# Patient Record
Sex: Female | Born: 1990 | Race: Black or African American | Hispanic: No | Smoking: Never smoker
Health system: Southern US, Community
[De-identification: ages and names within clinical notes are randomized; demographics above are authoritative.]

## PROBLEM LIST (undated history)

## (undated) DIAGNOSIS — A749 Chlamydial infection, unspecified: Secondary | ICD-10-CM

## (undated) HISTORY — PX: NO PAST SURGERIES: SHX2092

---
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
## 2014-10-20 ENCOUNTER — Emergency Department (HOSPITAL_BASED_OUTPATIENT_CLINIC_OR_DEPARTMENT_OTHER)
Admission: EM | Admit: 2014-10-20 | Discharge: 2014-10-20 | Disposition: A | Discharge status: Home / Self Care | Payer: Self-pay | Attending: Emergency Medicine | Admitting: Emergency Medicine

## 2014-10-20 ENCOUNTER — Encounter (HOSPITAL_BASED_OUTPATIENT_CLINIC_OR_DEPARTMENT_OTHER): Payer: Self-pay | Admitting: *Deleted

## 2014-10-20 ENCOUNTER — Emergency Department (HOSPITAL_BASED_OUTPATIENT_CLINIC_OR_DEPARTMENT_OTHER): Payer: Self-pay

## 2014-10-20 DIAGNOSIS — Z3490 Encounter for supervision of normal pregnancy, unspecified, unspecified trimester: Secondary | ICD-10-CM

## 2014-10-20 DIAGNOSIS — A599 Trichomoniasis, unspecified: Secondary | ICD-10-CM

## 2014-10-20 DIAGNOSIS — Z331 Pregnant state, incidental: Secondary | ICD-10-CM | POA: Insufficient documentation

## 2014-10-20 DIAGNOSIS — R52 Pain, unspecified: Secondary | ICD-10-CM

## 2014-10-20 DIAGNOSIS — A5901 Trichomonal vulvovaginitis: Secondary | ICD-10-CM | POA: Insufficient documentation

## 2014-10-20 LAB — URINALYSIS, ROUTINE W REFLEX MICROSCOPIC
Glucose, UA: NEGATIVE mg/dL
HGB URINE DIPSTICK: NEGATIVE
KETONES UR: 15 mg/dL — AB
NITRITE: NEGATIVE
Protein, ur: NEGATIVE mg/dL
SPECIFIC GRAVITY, URINE: 1.043 — AB (ref 1.005–1.030)
Urobilinogen, UA: 2 mg/dL — ABNORMAL HIGH (ref 0.0–1.0)
pH: 6 (ref 5.0–8.0)

## 2014-10-20 LAB — WET PREP, GENITAL: Yeast Wet Prep HPF POC: NONE SEEN

## 2014-10-20 LAB — PREGNANCY, URINE: Preg Test, Ur: POSITIVE — AB

## 2014-10-20 LAB — URINE MICROSCOPIC-ADD ON

## 2014-10-20 LAB — HCG, QUANTITATIVE, PREGNANCY: HCG, BETA CHAIN, QUANT, S: 29574 m[IU]/mL — AB (ref <5)

## 2014-10-20 MED ORDER — METRONIDAZOLE 500 MG PO TABS: 500.0000 mg | ORAL_TABLET | Freq: Two times a day (BID) | ORAL | Status: DC

## 2014-10-20 NOTE — ED Notes (Signed)
Patient transported to Ultrasound 

## 2014-10-20 NOTE — ED Notes (Signed)
MD at bedside. 

## 2014-10-20 NOTE — ED Provider Notes (Signed)
CSN: [id number]     Arrival date & time 10/20/14  1946 History  This chart was scribed for Rolan BuccoMelanie Clydia Nieves, MD by Octavia HeirArianna Nassar, ED Scribe. This patient was seen in room MH09/MH09 and the patient's care was started at 8:53 PM.     Chief Complaint  Patient presents with  . Abdominal Pain      The history is provided by the patient. No language interpreter was used.   HPI Comments: Bethany Cobb is a 24 y.o. female who presents to the Emergency Department complaining of lower abdominal pain onset 4 days ago. Pt has associated vaginal discharge, headaches and nausea. Pt reports her last menstrual cycle in mid-April and reports spotting in May. Pt was on the depo shot for birth control but stopped taking it. She denies difficulty urinating and bleeding.  History reviewed. No pertinent past medical history. History reviewed. No pertinent past surgical history. No family history on file. History  Substance Use Topics  . Smoking status: Never Smoker   . Smokeless tobacco: Not on file  . Alcohol Use: Yes   OB History    No data available     Review of Systems  Constitutional: Negative for fever, chills, diaphoresis and fatigue.  HENT: Negative for congestion, rhinorrhea and sneezing.   Eyes: Negative.   Respiratory: Negative for cough, chest tightness and shortness of breath.   Cardiovascular: Negative for chest pain and leg swelling.  Gastrointestinal: Positive for nausea and abdominal pain. Negative for vomiting, diarrhea and blood in stool.  Genitourinary: Positive for vaginal discharge. Negative for frequency, hematuria, flank pain, vaginal bleeding and difficulty urinating.  Musculoskeletal: Negative for back pain and arthralgias.  Skin: Negative for rash.  Neurological: Negative for dizziness, speech difficulty, weakness, numbness and headaches.      Allergies  Review of patient's allergies indicates no known allergies.  Home Medications   Prior to Admission medications    Medication Sig Start Date End Date Taking? Authorizing Provider  metroNIDAZOLE (FLAGYL) 500 MG tablet Take 1 tablet (500 mg total) by mouth 2 (two) times daily. One po bid x 7 days 10/20/14   Rolan BuccoMelanie Maleyah Evans, MD   Triage vitals: BP 110/58 mmHg  Pulse 90  Temp(Src) 98.6 F (37 C) (Oral)  Resp 20  Ht 5' (1.524 m)  Wt 108 lb 6 oz (49.159 kg)  BMI 21.17 kg/m2  SpO2 100%  LMP 09/04/2014 Physical Exam  Constitutional: She is oriented to person, place, and time. She appears well-developed and well-nourished.  HENT:  Head: Normocephalic and atraumatic.  Eyes: Pupils are equal, round, and reactive to light.  Neck: Normal range of motion. Neck supple.  Cardiovascular: Normal rate, regular rhythm and normal heart sounds.   Pulmonary/Chest: Effort normal and breath sounds normal. No respiratory distress. She has no wheezes. She has no rales. She exhibits no tenderness.  Abdominal: Soft. Bowel sounds are normal. There is no tenderness. There is no rebound and no guarding.  Genitourinary:  Patient has copious amounts of yellow-green vaginal discharge. There is no cervical motion tenderness or adnexal tenderness.  Musculoskeletal: Normal range of motion. She exhibits no edema.  Lymphadenopathy:    She has no cervical adenopathy.  Neurological: She is alert and oriented to person, place, and time.  Skin: Skin is warm and dry. No rash noted.  Psychiatric: She has a normal mood and affect.    ED Course  Procedures  DIAGNOSTIC STUDIES: Oxygen Saturation is 100% on RA, normal by my interpretation.  COORDINATION OF CARE:  8:55 PM Discussed treatment plan which includes ultrasound and pelvic exam with pt at bedside and pt agreed to plan.   Labs Review Labs Reviewed  WET PREP, GENITAL - Abnormal; Notable for the following:    Trich, Wet Prep MODERATE (*)    Clue Cells Wet Prep HPF POC FEW (*)    WBC, Wet Prep HPF POC TOO NUMEROUS TO COUNT (*)    All other components within normal limits   URINALYSIS, ROUTINE W REFLEX MICROSCOPIC (NOT AT [facility]) - Abnormal; Notable for the following:    Color, Urine AMBER (*)    APPearance CLOUDY (*)    Specific Gravity, Urine 1.043 (*)    Bilirubin Urine SMALL (*)    Ketones, ur 15 (*)    Urobilinogen, UA 2.0 (*)    Leukocytes, UA MODERATE (*)    All other components within normal limits  PREGNANCY, URINE - Abnormal; Notable for the following:    Preg Test, Ur POSITIVE (*)    All other components within normal limits  URINE MICROSCOPIC-ADD ON - Abnormal; Notable for the following:    Bacteria, UA MANY (*)    All other components within normal limits  HCG, QUANTITATIVE, PREGNANCY - Abnormal; Notable for the following:    hCG, Beta Chain, Quant, S [address] (*)    All other components within normal limits  URINE CULTURE  HIV ANTIBODY (ROUTINE TESTING)  RPR  GC/CHLAMYDIA PROBE AMP (March ARB) NOT AT [facility]    Imaging Review US Ob Comp Less 14 Wks  10/20/2014   CLINICAL DATA:  Pelvic cramping for 1 week.  Vaginal discharge.  EXAM: OBSTETRIC <14 WK [place] AND TRANSVAGINAL OB US  TECHNIQUE: Both transabdominal and transvaginal ultrasound examinations were performed for complete evaluation of the gestation as well as the maternal uterus, adnexal regions, and pelvic cul-de-sac. Transvaginal technique was performed to assess early pregnancy.  COMPARISON:  None.  FINDINGS: Intrauterine gestational sac: Single  Yolk sac:  Yes  Embryo:  No  MSD: 14  mm   6 w   2  d  [place] EDC: 06/13/2015  Maternal uterus/adnexae: Small subchorionic hemorrhage. Normal left ovary. 2.8 x 1.8 x 1.7 cm area within the right ovary which may represent a hemorrhagic corpus luteum cyst.  IMPRESSION: Gestational sac, 6 weeks 2 days in size. No visible embryo. Small subchorionic hemorrhage. Probable hemorrhagic corpus luteum cyst.   Electronically Signed   By: Francene Boyers M.D.   On: 10/20/2014 21:50   US Ob Transvaginal  10/20/2014   CLINICAL DATA:  Pelvic cramping for 1 week.  Vaginal  discharge.  EXAM: OBSTETRIC <14 WK [place] AND TRANSVAGINAL OB US  TECHNIQUE: Both transabdominal and transvaginal ultrasound examinations were performed for complete evaluation of the gestation as well as the maternal uterus, adnexal regions, and pelvic cul-de-sac. Transvaginal technique was performed to assess early pregnancy.  COMPARISON:  None.  FINDINGS: Intrauterine gestational sac: Single  Yolk sac:  Yes  Embryo:  No  MSD: 14  mm   6 w   2  d  [place] EDC: 06/13/2015  Maternal uterus/adnexae: Small subchorionic hemorrhage. Normal left ovary. 2.8 x 1.8 x 1.7 cm area within the right ovary which may represent a hemorrhagic corpus luteum cyst.  IMPRESSION: Gestational sac, 6 weeks 2 days in size. No visible embryo. Small subchorionic hemorrhage. Probable hemorrhagic corpus luteum cyst.   Electronically Signed   By: Francene Boyers M.D.   On: 10/20/2014 21:50     EKG Interpretation None  MDM   Final diagnoses:  Trichimoniasis  IUP (intrauterine pregnancy), incidental    Patient presents with some lower abdominal discomfort nausea and vaginal discharge. She does have positive Trichomonas. She'll be treated with Flagyl. She has no bleeding. She does not think that she ever got RhoGAM shots with her prior pregnancies. But since she has no bleeding I did not feel that she needed an Rh type tonight. She was encouraged to start a prenatal vitamin. She was encouraged to have close follow-up with her OB/GYN in [facility]. She was advised to return if she has any worsening symptoms. She was advised that she needs to notify her partner that he needs to get tested and treated for Trichomonas as well.  I personally performed the services described in this documentation, which was scribed in my presence.  The recorded information has been reviewed and considered.   Rolan Bucco, MD 10/20/14 [phone]

## 2014-10-20 NOTE — Discharge Instructions (Signed)
First Trimester of Pregnancy °The first trimester of pregnancy is from week 1 until the end of week 12 (months 1 through 3). A week after a sperm fertilizes an egg, the egg will implant on the wall of the uterus. This embryo will begin to develop into a baby. Genes from you and your partner are forming the baby. The female genes determine whether the baby is a boy or a girl. At 6-8 weeks, the eyes and face are formed, and the heartbeat can be seen on ultrasound. At the end of 12 weeks, all the baby's organs are formed.  °Now that you are pregnant, you will want to do everything you can to have a healthy baby. Two of the most important things are to get good prenatal care and to follow your health care provider's instructions. Prenatal care is all the medical care you receive before the baby's birth. This care will help prevent, find, and treat any problems during the pregnancy and childbirth. °BODY CHANGES °Your body goes through many changes during pregnancy. The changes vary from woman to woman.  °· You may gain or lose a couple of pounds at first. °· You may feel sick to your stomach (nauseous) and throw up (vomit). If the vomiting is uncontrollable, call your health care provider. °· You may tire easily. °· You may develop headaches that can be relieved by medicines approved by your health care provider. °· You may urinate more often. Painful urination may mean you have a bladder infection. °· You may develop heartburn as a result of your pregnancy. °· You may develop constipation because certain hormones are causing the muscles that push waste through your intestines to slow down. °· You may develop hemorrhoids or swollen, bulging veins (varicose veins). °· Your breasts may begin to grow larger and become tender. Your nipples may stick out more, and the tissue that surrounds them (areola) may become darker. °· Your gums may bleed and may be sensitive to brushing and flossing. °· Dark spots or blotches (chloasma,  mask of pregnancy) may develop on your face. This will likely fade after the baby is born. °· Your menstrual periods will stop. °· You may have a loss of appetite. °· You may develop cravings for certain kinds of food. °· You may have changes in your emotions from day to day, such as being excited to be pregnant or being concerned that something may go wrong with the pregnancy and baby. °· You may have more vivid and strange dreams. °· You may have changes in your hair. These can include thickening of your hair, rapid growth, and changes in texture. Some women also have hair loss during or after pregnancy, or hair that feels dry or thin. Your hair will most likely return to normal after your baby is born. °WHAT TO EXPECT AT YOUR PRENATAL VISITS °During a routine prenatal visit: °· You will be weighed to make sure you and the baby are growing normally. °· Your blood pressure will be taken. °· Your abdomen will be measured to track your baby's growth. °· The fetal heartbeat will be listened to starting around week 10 or 12 of your pregnancy. °· Test results from any previous visits will be discussed. °Your health care provider may ask you: °· How you are feeling. °· If you are feeling the baby move. °· If you have had any abnormal symptoms, such as leaking fluid, bleeding, severe headaches, or abdominal cramping. °· If you have any questions. °Other tests   that may be performed during your first trimester include: °· Blood tests to find your blood type and to check for the presence of any previous infections. They will also be used to check for low iron levels (anemia) and Rh antibodies. Later in the pregnancy, blood tests for diabetes will be done along with other tests if problems develop. °· Urine tests to check for infections, diabetes, or protein in the urine. °· An ultrasound to confirm the proper growth and development of the baby. °· An amniocentesis to check for possible genetic problems. °· Fetal screens for  spina bifida and Down syndrome. °· You may need other tests to make sure you and the baby are doing well. °HOME CARE INSTRUCTIONS  °Medicines °· Follow your health care provider's instructions regarding medicine use. Specific medicines may be either safe or unsafe to take during pregnancy. °· Take your prenatal vitamins as directed. °· If you develop constipation, try taking a stool softener if your health care provider approves. °Diet °· Eat regular, well-balanced meals. Choose a variety of foods, such as meat or vegetable-based protein, fish, milk and low-fat dairy products, vegetables, fruits, and whole grain breads and cereals. Your health care provider will help you determine the amount of weight gain that is right for you. °· Avoid raw meat and uncooked cheese. These carry germs that can cause birth defects in the baby. °· Eating four or five small meals rather than three large meals a day may help relieve nausea and vomiting. If you start to feel nauseous, eating a few soda crackers can be helpful. Drinking liquids between meals instead of during meals also seems to help nausea and vomiting. °· If you develop constipation, eat more high-fiber foods, such as fresh vegetables or fruit and whole grains. Drink enough fluids to keep your urine clear or pale yellow. °Activity and Exercise °· Exercise only as directed by your health care provider. Exercising will help you: °· Control your weight. °· Stay in shape. °· Be prepared for labor and delivery. °· Experiencing pain or cramping in the lower abdomen or low back is a good sign that you should stop exercising. Check with your health care provider before continuing normal exercises. °· Try to avoid standing for long periods of time. Move your legs often if you must stand in one place for a long time. °· Avoid heavy lifting. °· Wear low-heeled shoes, and practice good posture. °· You may continue to have sex unless your health care provider directs you  otherwise. °Relief of Pain or Discomfort °· Wear a good support bra for breast tenderness.   °· Take warm sitz baths to soothe any pain or discomfort caused by hemorrhoids. Use hemorrhoid cream if your health care provider approves.   °· Rest with your legs elevated if you have leg cramps or low back pain. °· If you develop varicose veins in your legs, wear support hose. Elevate your feet for 15 minutes, 3-4 times a day. Limit salt in your diet. °Prenatal Care °· Schedule your prenatal visits by the twelfth week of pregnancy. They are usually scheduled monthly at first, then more often in the last 2 months before delivery. °· Write down your questions. Take them to your prenatal visits. °· Keep all your prenatal visits as directed by your health care provider. °Safety °· Wear your seat belt at all times when driving. °· Make a list of emergency phone numbers, including numbers for family, friends, the hospital, and police and fire departments. °General Tips °·   Ask your health care provider for a referral to a local prenatal education class. Begin classes no later than at the beginning of month 6 of your pregnancy. °· Ask for help if you have counseling or nutritional needs during pregnancy. Your health care provider can offer advice or refer you to specialists for help with various needs. °· Do not use hot tubs, steam rooms, or saunas. °· Do not douche or use tampons or scented sanitary pads. °· Do not cross your legs for long periods of time. °· Avoid cat litter boxes and soil used by cats. These carry germs that can cause birth defects in the baby and possibly loss of the fetus by miscarriage or stillbirth. °· Avoid all smoking, herbs, alcohol, and medicines not prescribed by your health care provider. Chemicals in these affect the formation and growth of the baby. °· Schedule a dentist appointment. At home, brush your teeth with a soft toothbrush and be gentle when you floss. °SEEK MEDICAL CARE IF:  °· You have  dizziness. °· You have mild pelvic cramps, pelvic pressure, or nagging pain in the abdominal area. °· You have persistent nausea, vomiting, or diarrhea. °· You have a bad smelling vaginal discharge. °· You have pain with urination. °· You notice increased swelling in your face, hands, legs, or ankles. °SEEK IMMEDIATE MEDICAL CARE IF:  °· You have a fever. °· You are leaking fluid from your vagina. °· You have spotting or bleeding from your vagina. °· You have severe abdominal cramping or pain. °· You have rapid weight gain or loss. °· You vomit blood or material that looks like coffee grounds. °· You are exposed to German measles and have never had them. °· You are exposed to fifth disease or chickenpox. °· You develop a severe headache. °· You have shortness of breath. °· You have any kind of trauma, such as from a fall or a car accident. °Document Released: 04/27/2001 Document Revised: 09/17/2013 Document Reviewed: 03/13/2013 °ExitCare® Patient Information ©2015 [employer]. This information is not intended to replace advice given to you by your health care provider. Make sure you discuss any questions you have with your health care provider. °Trichomoniasis °Trichomoniasis is an infection caused by an organism called Trichomonas. The infection can affect both women and men. In women, the outer female genitalia and the vagina are affected. In men, the penis is mainly affected, but the prostate and other reproductive organs can also be involved. Trichomoniasis is a sexually transmitted infection (STI) and is most often passed to another person through sexual contact.  °RISK FACTORS °· Having unprotected sexual intercourse. °· Having sexual intercourse with an infected partner. °SIGNS AND SYMPTOMS  °Symptoms of trichomoniasis in women include: °· Abnormal gray-green frothy vaginal discharge. °· Itching and irritation of the vagina. °· Itching and irritation of the area outside the vagina. °Symptoms of  trichomoniasis in men include:  °· Penile discharge with or without pain. °· Pain during urination. This results from inflammation of the urethra. °DIAGNOSIS  °Trichomoniasis may be found during a Pap test or physical exam. Your health care provider may use one of the following methods to help diagnose this infection: °· Examining vaginal discharge under a microscope. For men, urethral discharge would be examined. °· Testing the pH of the vagina with a test tape. °· Using a vaginal swab test that checks for the Trichomonas organism. A test is available that provides results within a few minutes. °· Doing a culture test for the organism.   This is not usually needed. °TREATMENT  °· You may be given medicine to fight the infection. Women should inform their health care provider if they could be or are pregnant. Some medicines used to treat the infection should not be taken during pregnancy. °· Your health care provider may recommend over-the-counter medicines or creams to decrease itching or irritation. °· Your sexual partner will need to be treated if infected. °HOME CARE INSTRUCTIONS  °· Take medicines only as directed by your health care provider. °· Take over-the-counter medicine for itching or irritation as directed by your health care provider. °· Do not have sexual intercourse while you have the infection. °· Women should not douche or wear tampons while they have the infection. °· Discuss your infection with your partner. Your partner may have gotten the infection from you, or you may have gotten it from your partner. °· Have your sex partner get examined and treated if necessary. °· Practice safe, informed, and protected sex. °· See your health care provider for other STI testing. °SEEK MEDICAL CARE IF:  °· You still have symptoms after you finish your medicine. °· You develop abdominal pain. °· You have pain when you urinate. °· You have bleeding after sexual intercourse. °· You develop a rash. °· Your  medicine makes you sick or makes you throw up (vomit). °MAKE SURE YOU: °· Understand these instructions. °· Will watch your condition. °· Will get help right away if you are not doing well or get worse. °Document Released: 10/27/2000 Document Revised: 09/17/2013 Document Reviewed: 02/12/2013 °ExitCare® Patient Information ©2015 [employer]. This information is not intended to replace advice given to you by your health care provider. Make sure you discuss any questions you have with your health care provider. ° °

## 2014-10-20 NOTE — ED Notes (Signed)
Pt here for lower abdominal pain with headache and change in vaginal discharge.  No urinary symptoms with this

## 2014-10-20 NOTE — ED Notes (Signed)
Patient returned from ultrasound.

## 2014-10-21 LAB — GC/CHLAMYDIA PROBE AMP (~~LOC~~) NOT AT ARMC
Chlamydia: POSITIVE — AB
Neisseria Gonorrhea: NEGATIVE

## 2014-10-22 ENCOUNTER — Telehealth (HOSPITAL_COMMUNITY): Payer: Self-pay

## 2014-10-22 LAB — URINE CULTURE: Colony Count: 10000

## 2014-10-22 LAB — RPR: RPR Ser Ql: NONREACTIVE

## 2014-10-22 LAB — HIV ANTIBODY (ROUTINE TESTING W REFLEX): HIV SCREEN 4TH GENERATION: NONREACTIVE

## 2014-10-22 NOTE — ED Notes (Signed)
Positive for chlamydia. Chart sent to edp office for review 

## 2014-10-24 ENCOUNTER — Telehealth (HOSPITAL_COMMUNITY): Payer: Self-pay

## 2014-10-24 NOTE — Telephone Encounter (Signed)
Chart returned from MD office on 10/23/2014. Orders to give azithromycin 1 gram po x 1. Pt was contacted , informed. Advised to abstain from sexual activity x 10 days and to notify partner(s) for testing and treatment.  Medication called to [facility] in [place] [phone] and left on MD line.

## 2014-12-20 ENCOUNTER — Encounter (HOSPITAL_COMMUNITY): Payer: Self-pay | Admitting: *Deleted

## 2014-12-20 ENCOUNTER — Inpatient Hospital Stay (HOSPITAL_COMMUNITY)
Admission: AD | Admit: 2014-12-20 | Discharge: 2014-12-20 | Disposition: A | Discharge status: Home / Self Care | Payer: Self-pay | Source: Ambulatory Visit | Attending: Family Medicine | Admitting: Family Medicine

## 2014-12-20 ENCOUNTER — Inpatient Hospital Stay (HOSPITAL_COMMUNITY): Payer: Self-pay

## 2014-12-20 DIAGNOSIS — O4692 Antepartum hemorrhage, unspecified, second trimester: Secondary | ICD-10-CM

## 2014-12-20 DIAGNOSIS — O039 Complete or unspecified spontaneous abortion without complication: Secondary | ICD-10-CM | POA: Insufficient documentation

## 2014-12-20 DIAGNOSIS — O26899 Other specified pregnancy related conditions, unspecified trimester: Secondary | ICD-10-CM

## 2014-12-20 DIAGNOSIS — O4690 Antepartum hemorrhage, unspecified, unspecified trimester: Secondary | ICD-10-CM

## 2014-12-20 DIAGNOSIS — R109 Unspecified abdominal pain: Secondary | ICD-10-CM

## 2014-12-20 DIAGNOSIS — Z3A14 14 weeks gestation of pregnancy: Secondary | ICD-10-CM

## 2014-12-20 DIAGNOSIS — O9989 Other specified diseases and conditions complicating pregnancy, childbirth and the puerperium: Secondary | ICD-10-CM

## 2014-12-20 HISTORY — DX: Chlamydial infection, unspecified: A74.9

## 2014-12-20 LAB — WET PREP, GENITAL
Trich, Wet Prep: NONE SEEN
Yeast Wet Prep HPF POC: NONE SEEN

## 2014-12-20 LAB — URINALYSIS, ROUTINE W REFLEX MICROSCOPIC
BILIRUBIN URINE: NEGATIVE
Glucose, UA: NEGATIVE mg/dL
KETONES UR: NEGATIVE mg/dL
NITRITE: NEGATIVE
PH: 6 (ref 5.0–8.0)
Protein, ur: NEGATIVE mg/dL
Specific Gravity, Urine: >1.03 — ABNORMAL HIGH (ref 1.005–1.030)
Urobilinogen, UA: 1 mg/dL (ref 0.0–1.0)

## 2014-12-20 LAB — HCG, QUANTITATIVE, PREGNANCY: HCG, BETA CHAIN, QUANT, S: 15 m[IU]/mL — AB (ref <5)

## 2014-12-20 LAB — URINE MICROSCOPIC-ADD ON

## 2014-12-20 LAB — CBC
HEMATOCRIT: 30.3 % — AB (ref 36.0–46.0)
HEMOGLOBIN: 9.3 g/dL — AB (ref 12.0–15.0)
MCH: 21.4 pg — ABNORMAL LOW (ref 26.0–34.0)
MCHC: 30.7 g/dL (ref 30.0–36.0)
MCV: 69.8 fL — ABNORMAL LOW (ref 78.0–100.0)
Platelets: 233 10*3/uL (ref 150–400)
RBC: 4.34 MIL/uL (ref 3.87–5.11)
RDW: 13.5 % (ref 11.5–15.5)
WBC: 8.5 10*3/uL (ref 4.0–10.5)

## 2014-12-20 LAB — POCT PREGNANCY, URINE: PREG TEST UR: NEGATIVE

## 2014-12-20 LAB — ABO/RH: ABO/RH(D): B POS

## 2014-12-20 NOTE — MAU Note (Signed)
Pt reports lower abd cramping , had some heavy bleeding one week ago but should be [redacted] weeks pregnant

## 2014-12-20 NOTE — MAU Provider Note (Signed)
History     CSN: [id number]  Arrival date and time: 12/20/14 1191   First Provider Initiated Contact with Patient 12/20/14 0700      No chief complaint on file.  HPI Comments: Bethany Cobb is a 24 y.o. G1P0 at [redacted]w[redacted]d who presents today with cramping. She states that last week she has bleeding for a couple of days, but that stopped. However, yesterday she started to have cramps again. She denies any bleeding today.   Abdominal Pain This is a new problem. The current episode started yesterday. The onset quality is gradual. The problem occurs constantly. The problem has been unchanged. The pain is located in the suprapubic region. The pain is at a severity of 3/10. The quality of the pain is cramping. The abdominal pain does not radiate. Pertinent negatives include no constipation, diarrhea, dysuria, fever, frequency, nausea or vomiting. Nothing aggravates the pain. The pain is relieved by nothing. She has tried nothing for the symptoms.    No past medical history on file.  No past surgical history on file.  No family history on file.  History  Substance Use Topics  . Smoking status: Never Smoker   . Smokeless tobacco: Not on file  . Alcohol Use: Yes    Allergies: No Known Allergies  Prescriptions prior to admission  Medication Sig Dispense Refill Last Dose  . metroNIDAZOLE (FLAGYL) 500 MG tablet Take 1 tablet (500 mg total) by mouth 2 (two) times daily. One po bid x 7 days 14 tablet 0     Review of Systems  Constitutional: Negative for fever.  Gastrointestinal: Positive for abdominal pain. Negative for nausea, vomiting, diarrhea and constipation.  Genitourinary: Negative for dysuria, urgency and frequency.   Physical Exam   Blood pressure 124/79, pulse 82, temperature 98.4 F (36.9 C), temperature source Oral, resp. rate 16, height 5' 0.5" (1.537 m), weight 49.896 kg (110 lb), last menstrual period 09/08/2014, SpO2 100 %.  Physical Exam  Nursing note and vitals  reviewed. Constitutional: She is oriented to person, place, and time. She appears well-developed and well-nourished. No distress.  HENT:  Head: Normocephalic.  Cardiovascular: Normal rate.   Respiratory: Effort normal.  GI: Soft. There is no tenderness. There is no rebound.  Genitourinary:  Unable to auscultate FHT with doppler   Neurological: She is alert and oriented to person, place, and time.  Skin: Skin is warm and dry.  Psychiatric: She has a normal mood and affect.   Results for orders placed or performed during the hospital encounter of 12/20/14 (from the past 24 hour(s))  Urinalysis, Routine w reflex microscopic (not at [facility])     Status: Abnormal   Collection Time: 12/20/14  6:40 AM  Result Value Ref Range   Color, Urine YELLOW YELLOW   APPearance CLEAR CLEAR   Specific Gravity, Urine >1.030 (H) 1.005 - 1.030   pH 6.0 5.0 - 8.0   Glucose, UA NEGATIVE NEGATIVE mg/dL   Hgb urine dipstick SMALL (A) NEGATIVE   Bilirubin Urine NEGATIVE NEGATIVE   Ketones, ur NEGATIVE NEGATIVE mg/dL   Protein, ur NEGATIVE NEGATIVE mg/dL   Urobilinogen, UA 1.0 0.0 - 1.0 mg/dL   Nitrite NEGATIVE NEGATIVE   Leukocytes, UA MODERATE (A) NEGATIVE  Urine microscopic-add on     Status: None   Collection Time: 12/20/14  6:40 AM  Result Value Ref Range   Squamous Epithelial / LPF RARE RARE   WBC, UA 3-6 <3 WBC/hpf   Bacteria, UA RARE RARE    MAU  Course  Procedures  MDM 0800: Care turned over to S. Chase Picket, NP. [place] results pending.  Tawnya Crook 12/20/2014 7:01 AM. US Ob Transvaginal  12/20/2014   CLINICAL DATA:  Pain for 1 day.  Bleeding for 1 day.  EXAM: TRANSVAGINAL OB ULTRASOUND  TECHNIQUE: Transvaginal ultrasound was performed for complete evaluation of the gestation as well as the maternal uterus, adnexal regions, and pelvic cul-de-sac.  COMPARISON:  10/20/2014  FINDINGS: Intrauterine gestational sac: Not visualized  Yolk sac:  Not visualized  Embryo:  Not visualized  Maternal  uterus/adnexae: Endometrium normal in thickness. No adnexal masses. Trace free fluid in the pelvis.  IMPRESSION: The previously seen gestational sac no longer visualized. No intrauterine gestation present currently.   Electronically Signed   By: Charlett Nose M.D.   On: 12/20/2014 08:08  discussed with pt findings of probable anembryonic pregnancy Blood Type B Pos  Assessment and Plan  Failed pregnancy/SAB F/u with outpatient clinic appointment 2 weeks- clinic to call pt for an appointment Pt will need contraceptive counseling

## 2014-12-23 LAB — GC/CHLAMYDIA PROBE AMP (~~LOC~~) NOT AT ARMC
Chlamydia: NEGATIVE
NEISSERIA GONORRHEA: NEGATIVE

## 2015-01-03 ENCOUNTER — Encounter: Payer: Self-pay | Admitting: Family Medicine

## 2015-09-27 IMAGING — US US OB COMP LESS 14 WK
1 series · 14 of 28 positions shown · non-contrast
Comparison: None.

CLINICAL DATA: Pelvic cramping for 1 week.  Vaginal discharge.

EXAM:
OBSTETRIC <14 WK US AND TRANSVAGINAL OB US
TECHNIQUE: Both transabdominal and transvaginal ultrasound examinations were
performed for complete evaluation of the gestation as well as the
maternal uterus, adnexal regions, and pelvic cul-de-sac.
Transvaginal technique was performed to assess early pregnancy.

[Series 1: us ob comp less 14 wk · 0.17mm/px · 14 of 83 slices shown]
[im 4/83]
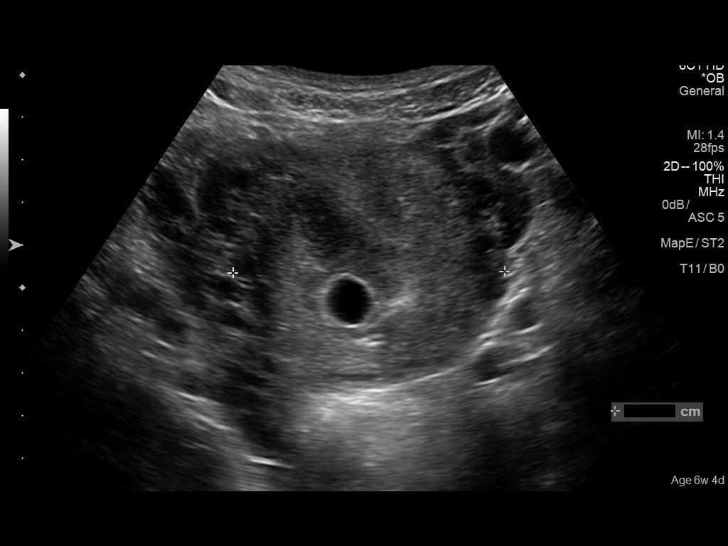
[im 10/83]
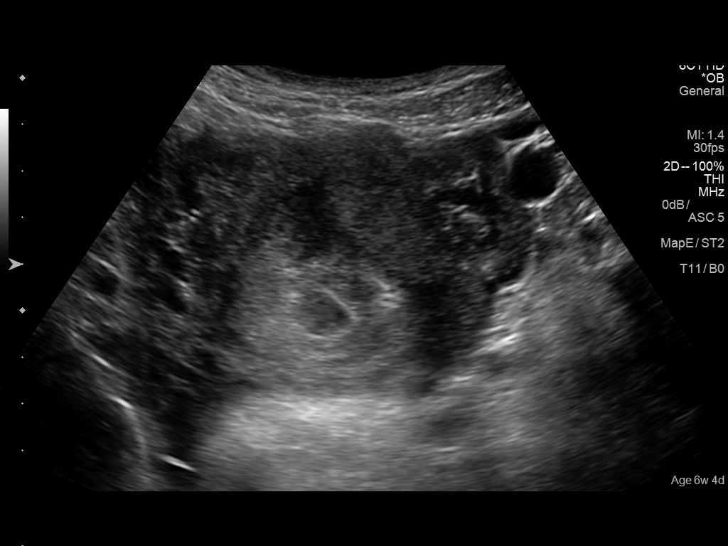
[im 16/83]
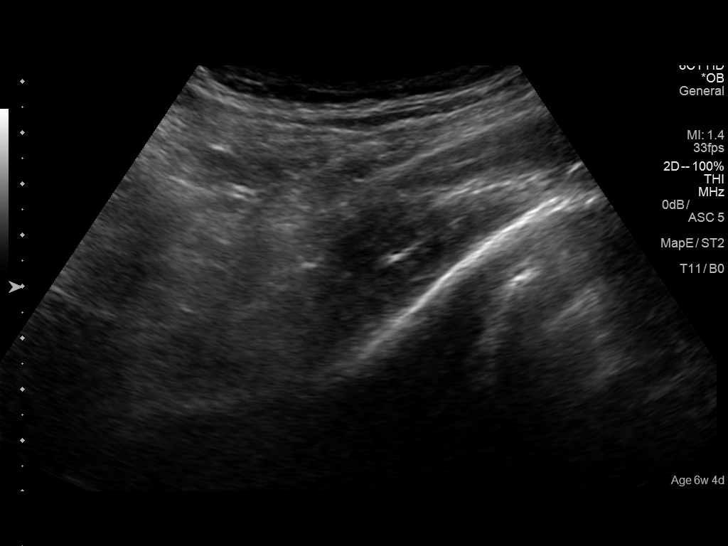
[im 22/83]
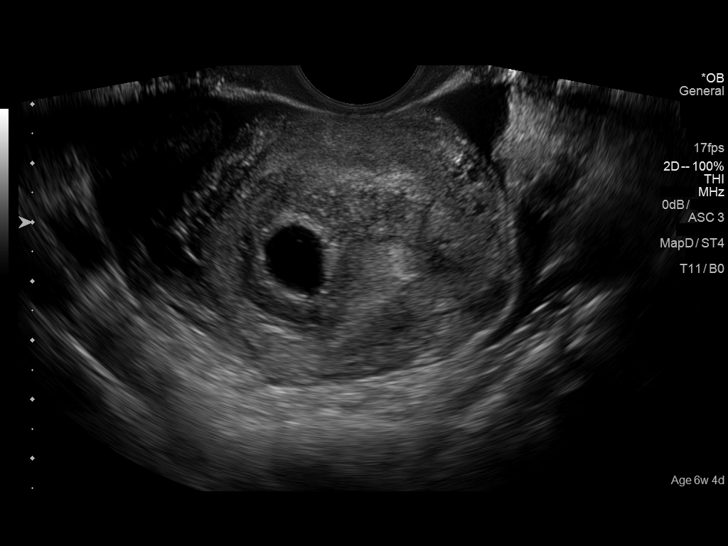
[im 28/83]
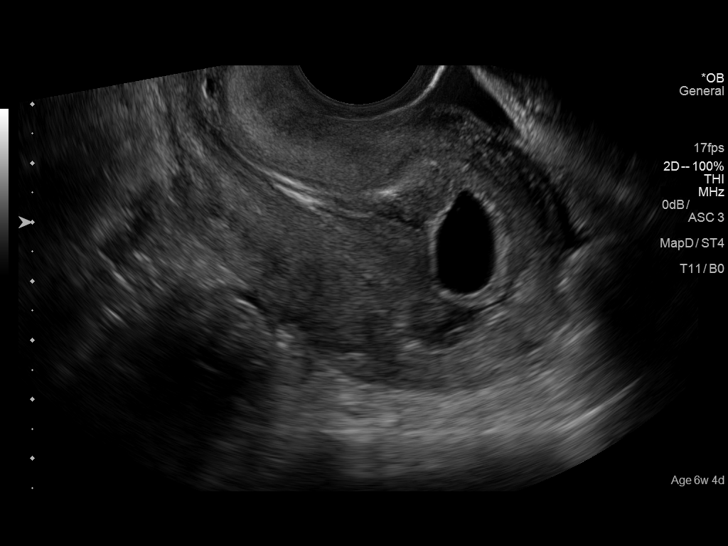
[im 34/83]
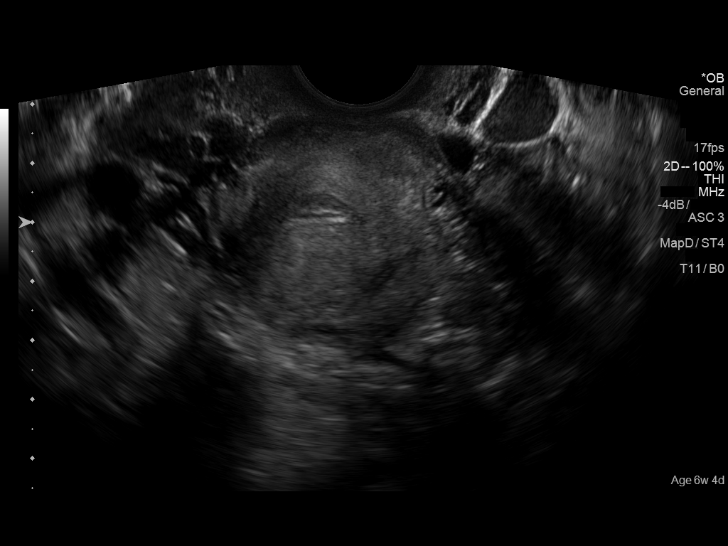
[im 40/83]
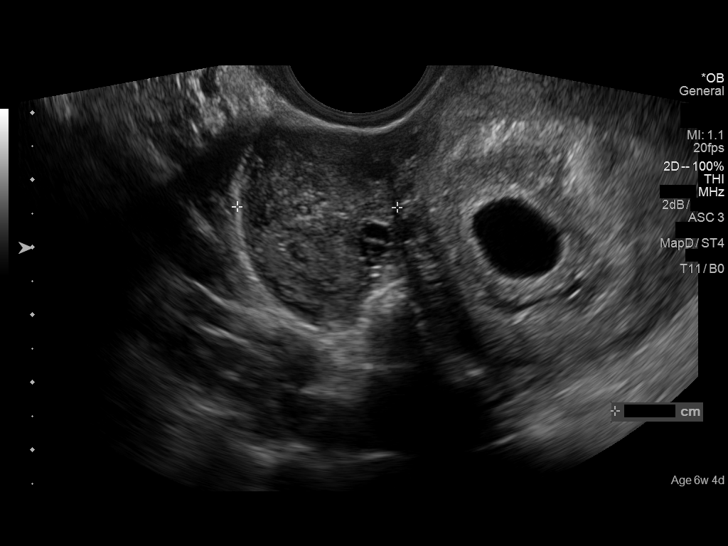
[im 46/83]
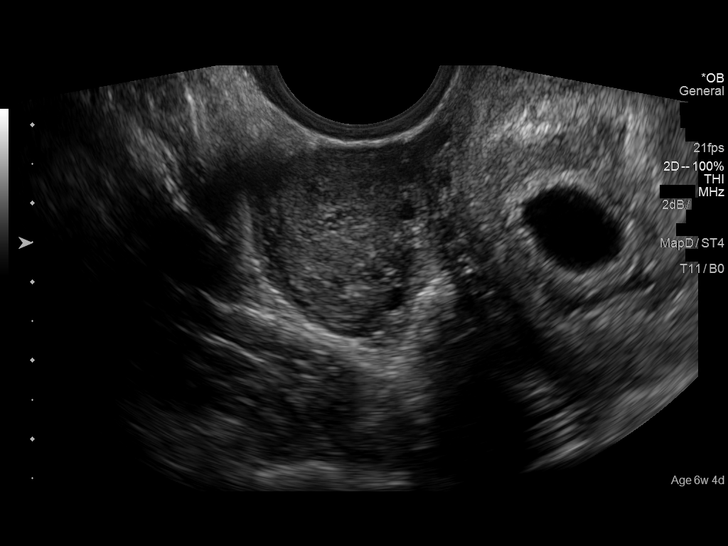
[im 52/83]
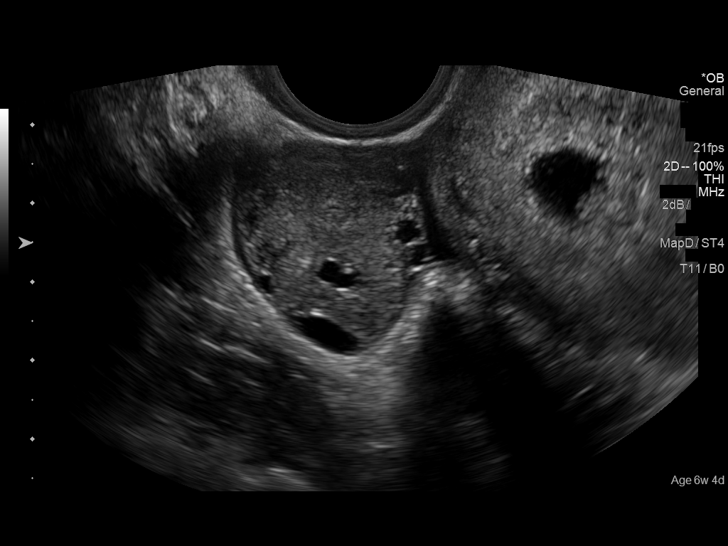
[im 58/83]
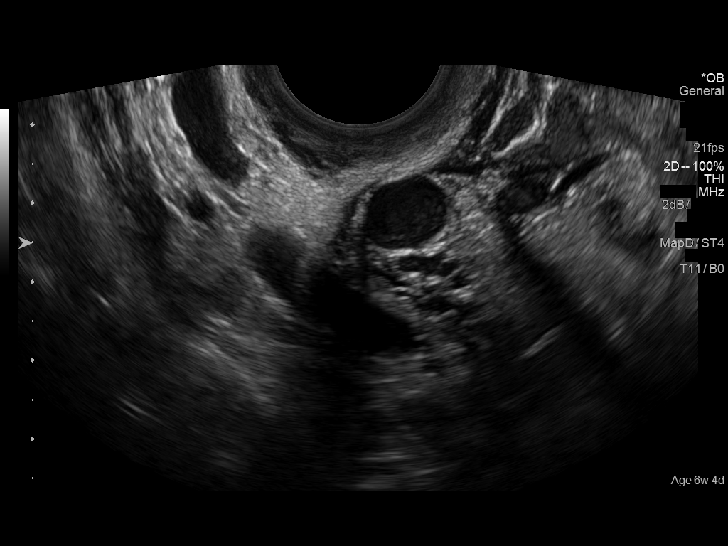
[im 64/83]
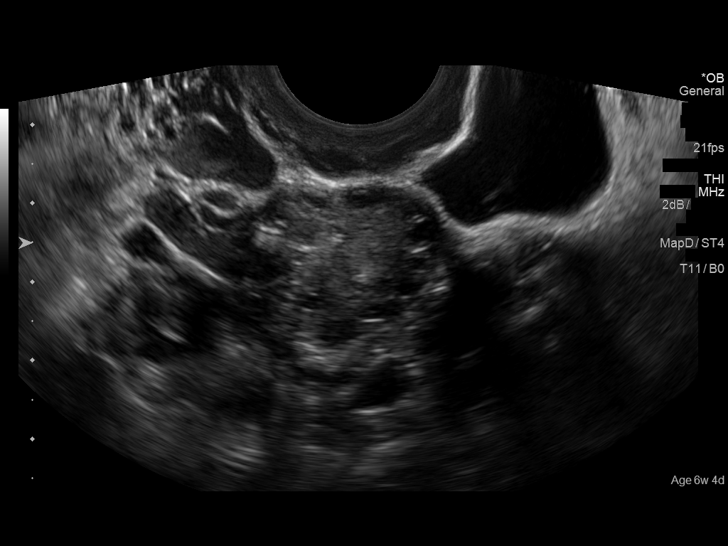
[im 70/83]
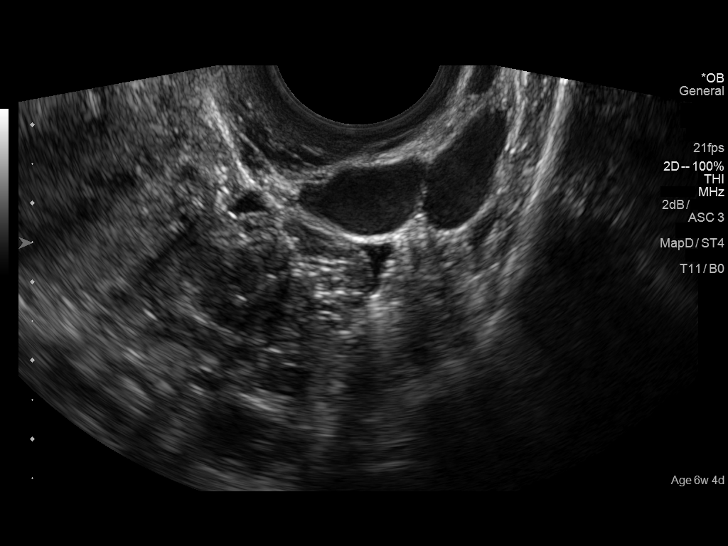
[im 76/83]
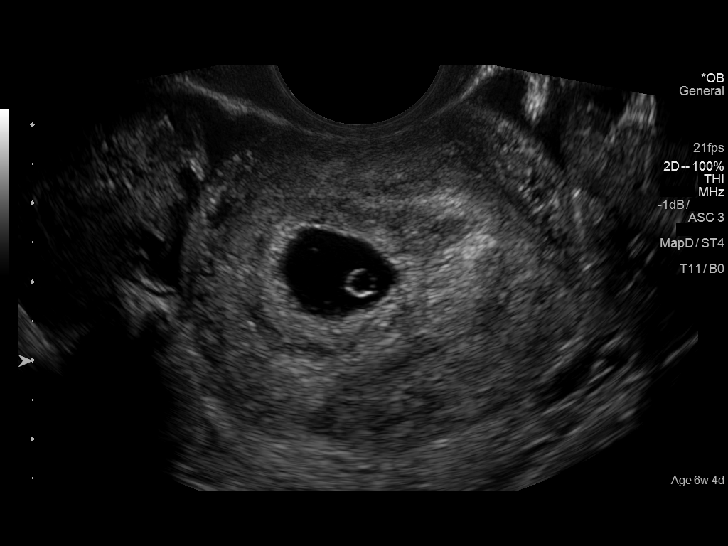
[im 83/83]
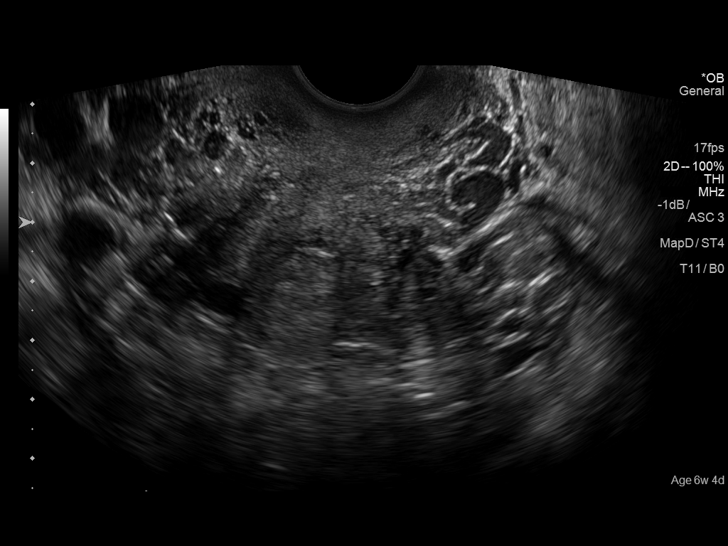

[14 of 28 positions shown; findings below may reference images not displayed]

FINDINGS: Intrauterine gestational sac: Single

Yolk sac:  Yes

Embryo:  No

MSD: 14  mm   6 w   2  d

US EDC: 06/13/2015

Maternal uterus/adnexae: Small subchorionic hemorrhage. Normal left
ovary. 2.8 x 1.8 x 1.7 cm area within the right ovary which may
represent a hemorrhagic corpus luteum cyst.
IMPRESSION: Gestational sac, 6 weeks 2 days in size. No visible embryo. Small
subchorionic hemorrhage. Probable hemorrhagic corpus luteum cyst.

## 2015-10-25 ENCOUNTER — Encounter (HOSPITAL_COMMUNITY): Payer: Self-pay | Admitting: *Deleted

## 2015-11-14 IMAGING — US US OB TRANSVAGINAL
1 series · 14 of 17 positions shown · non-contrast
Comparison: 10/20/2014

CLINICAL DATA: Pain for 1 day.  Bleeding for 1 day.

EXAM:
TRANSVAGINAL OB ULTRASOUND
TECHNIQUE: Transvaginal ultrasound was performed for complete evaluation of the
gestation as well as the maternal uterus, adnexal regions, and
pelvic cul-de-sac.

[Series 1: us ob transvaginal · 17 acquisitions, 14 frames shown]
[im 1/17]
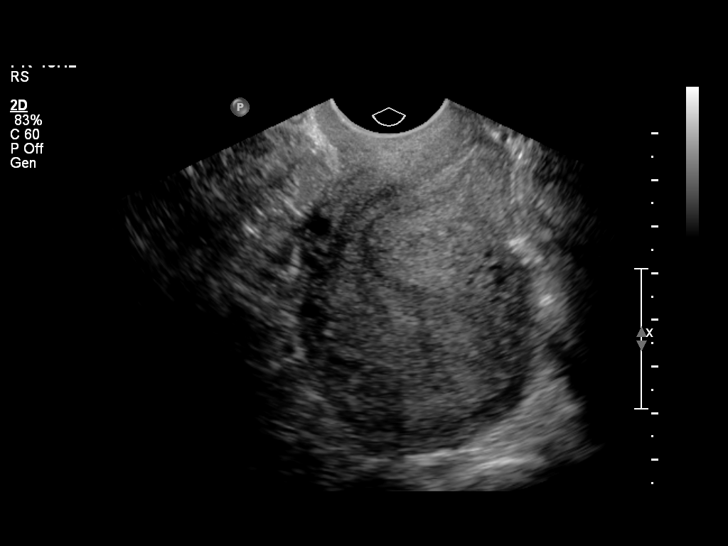
[im 2/17]
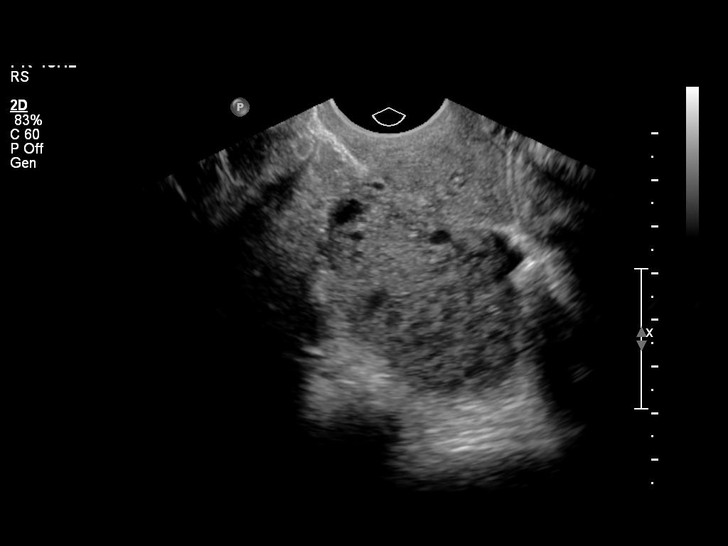
[im 4/17]
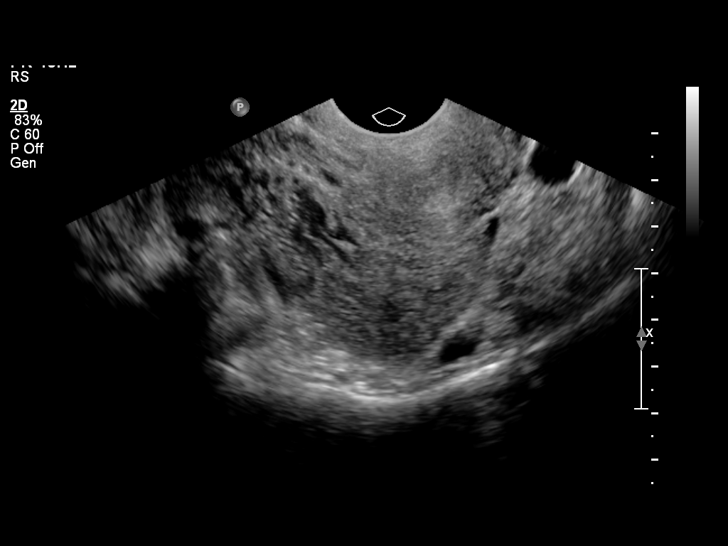
[im 5/17]
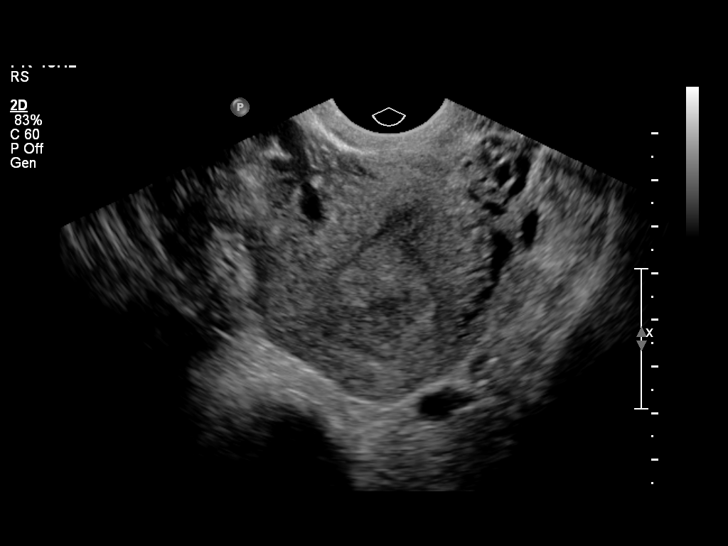
[im 6/17]
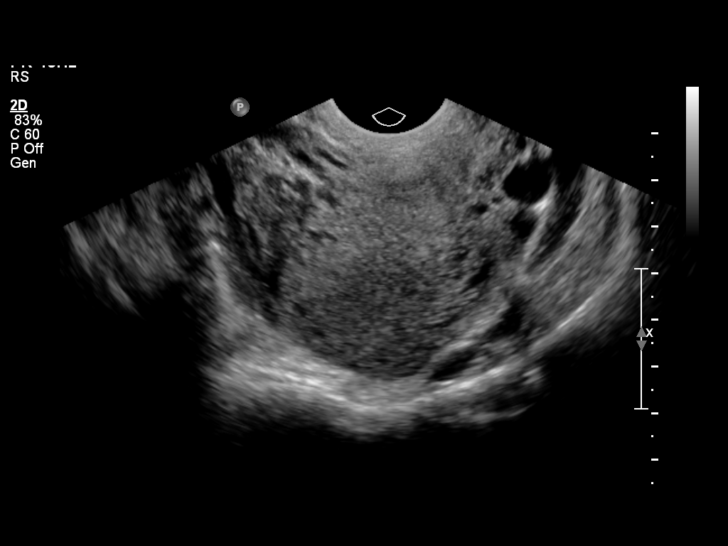
[im 7/17]
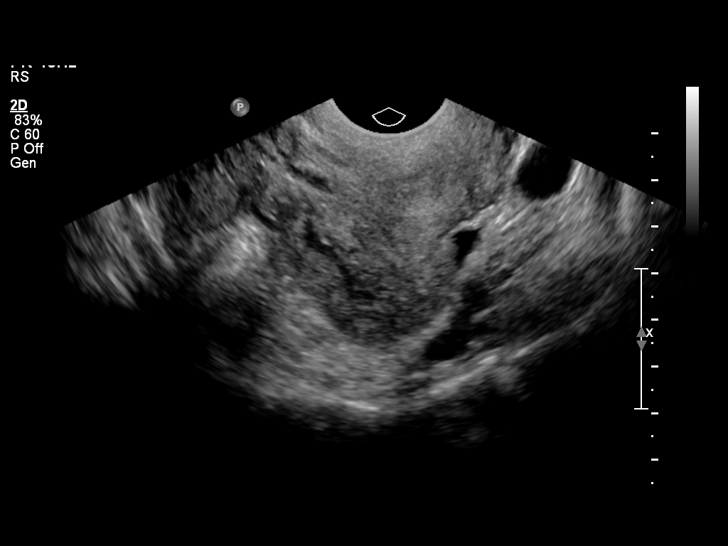
[im 8/17]
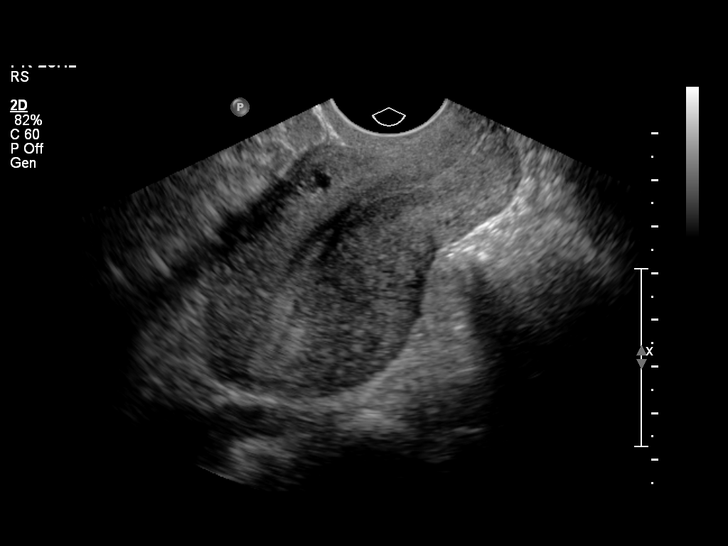
[im 10/17]
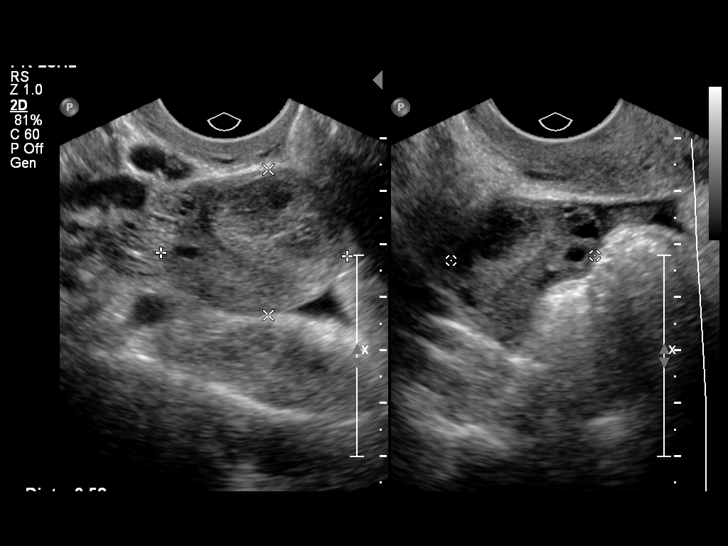
[im 11/17]
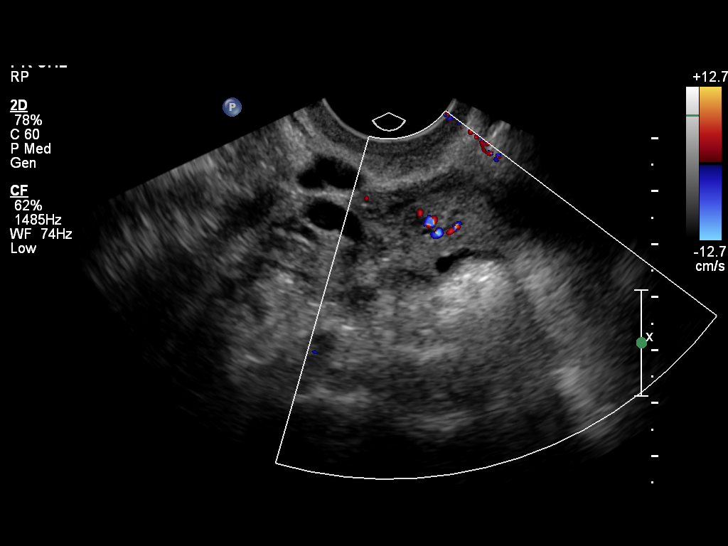
[im 12/17]
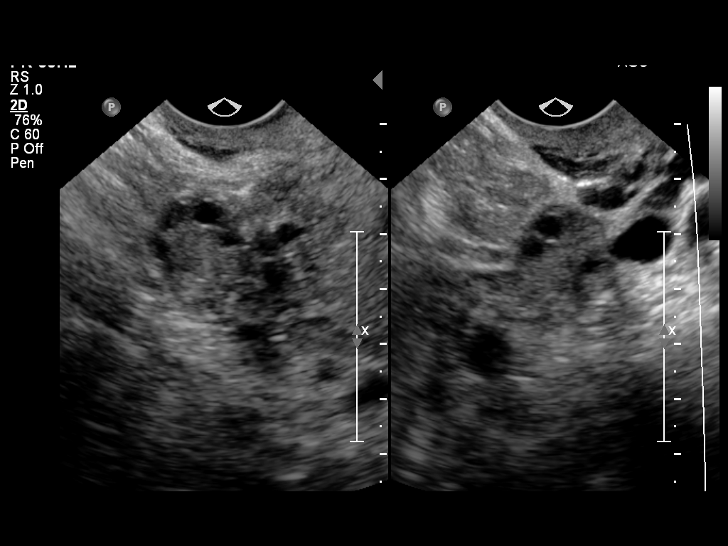
[im 13/17]
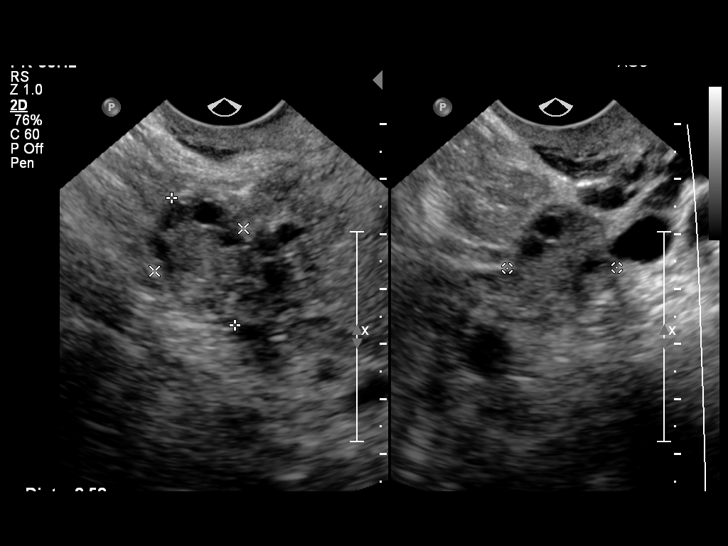
[im 14/17]
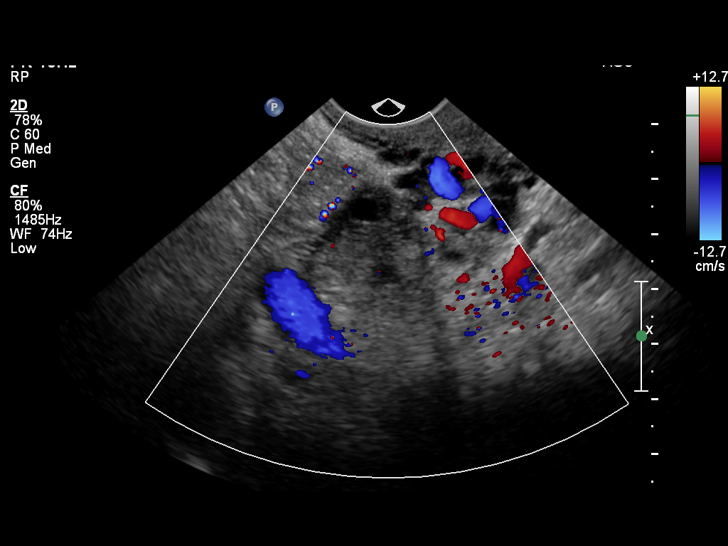
[im 16/17]
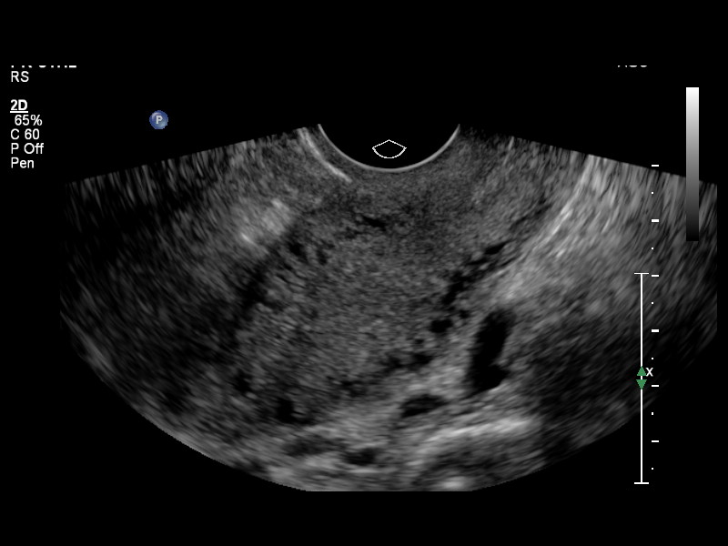
[im 17/17]
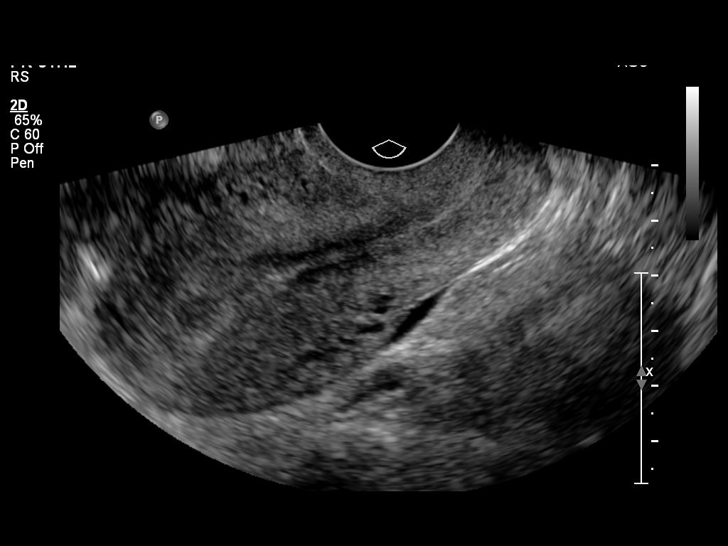

[14 of 17 positions shown; findings below may reference images not displayed]

FINDINGS: Intrauterine gestational sac: Not visualized

Yolk sac:  Not visualized

Embryo:  Not visualized

Maternal uterus/adnexae: Endometrium normal in thickness. No adnexal
masses. Trace free fluid in the pelvis.
IMPRESSION: The previously seen gestational sac no longer visualized. No
intrauterine gestation present currently.

## 2016-03-10 ENCOUNTER — Encounter (HOSPITAL_BASED_OUTPATIENT_CLINIC_OR_DEPARTMENT_OTHER): Payer: Self-pay | Admitting: *Deleted

## 2016-03-10 ENCOUNTER — Emergency Department (HOSPITAL_BASED_OUTPATIENT_CLINIC_OR_DEPARTMENT_OTHER)
Admission: EM | Admit: 2016-03-10 | Discharge: 2016-03-10 | Disposition: A | Discharge status: Home / Self Care | Payer: Medicaid Other | Attending: Emergency Medicine | Admitting: Emergency Medicine

## 2016-03-10 DIAGNOSIS — Y929 Unspecified place or not applicable: Secondary | ICD-10-CM | POA: Insufficient documentation

## 2016-03-10 DIAGNOSIS — L03113 Cellulitis of right upper limb: Secondary | ICD-10-CM

## 2016-03-10 DIAGNOSIS — L03115 Cellulitis of right lower limb: Secondary | ICD-10-CM | POA: Diagnosis not present

## 2016-03-10 DIAGNOSIS — Y999 Unspecified external cause status: Secondary | ICD-10-CM | POA: Insufficient documentation

## 2016-03-10 DIAGNOSIS — W57XXXA Bitten or stung by nonvenomous insect and other nonvenomous arthropods, initial encounter: Secondary | ICD-10-CM | POA: Insufficient documentation

## 2016-03-10 DIAGNOSIS — S40861A Insect bite (nonvenomous) of right upper arm, initial encounter: Secondary | ICD-10-CM | POA: Diagnosis present

## 2016-03-10 DIAGNOSIS — Y939 Activity, unspecified: Secondary | ICD-10-CM | POA: Insufficient documentation

## 2016-03-10 MED ORDER — CEPHALEXIN 500 MG PO CAPS: 500.0000 mg | ORAL_CAPSULE | Freq: Four times a day (QID) | ORAL | 0 refills | Status: DC

## 2016-03-10 MED ORDER — IBUPROFEN 200 MG PO TABS
600.0000 mg | ORAL_TABLET | Freq: Once | ORAL | Status: AC
  Administered 2016-03-10: 600 mg via ORAL
  Filled 2016-03-10: qty 1

## 2016-03-10 MED ORDER — CEPHALEXIN 250 MG PO CAPS
500.0000 mg | ORAL_CAPSULE | Freq: Once | ORAL | Status: AC
  Administered 2016-03-10: 500 mg via ORAL
  Filled 2016-03-10: qty 2

## 2016-03-10 MED FILL — CEPHALEXIN 500 MG CAPSULE: 500 | 6 days supply | Qty: 23 | Fill #0

## 2016-03-10 NOTE — Discharge Instructions (Signed)
Please keep an eye on the area of redness and swelling. If in 24 hours he continues to grow outside of the areas March need to return to the ED. Please take the antibiotics as prescribed for 6 days. He may also take ibuprofen and Tylenol for pain and swelling. If he develops signs of worsening infection including fever, chills, vomiting, inability to move right elbow return to ED. Follow-up with their PCP in 4-5 days for recheck.

## 2016-03-10 NOTE — ED Triage Notes (Signed)
?   Insect bite to right upper arm. Painful, with redness and swelling

## 2016-03-10 NOTE — ED Provider Notes (Signed)
[facility] Provider [facility]   CSN: [id number] Arrival date & time: 03/10/16  1054     History   Chief Complaint Chief Complaint  Patient presents with  . Insect Bite    HPI Bethany Cobb is a 25 y.o. female.  25 year old African-American female with no significant past medical history presents to the ED today with right upper arm redness and swelling. Patient states that she thought she was bit by a mosquito approximately 2 days ago. She put some rubbing alcohol on the bite without any concerns that time. Patient states that since the mosquito bites she's noticed that her arm has been getting more erythematous. States the edema "comes and goes". Patient denies any joint pain. She states that the area is warm and tender to touch. She has not tried any home for the pain. Nothing makes better or worse. She denies any fever, chills, headache, vision changes, abdominal pain, nausea, emesis or any other symptoms.       Past Medical History:  Diagnosis Date  . Chlamydia     There are no active problems to display for this patient.   Past Surgical History:  Procedure Laterality Date  . NO PAST SURGERIES      OB History    Gravida Para Term Preterm AB Living   4 3 3     3    SAB TAB Ectopic Multiple Live Births                   Home Medications    Prior to Admission medications   Medication Sig Start Date End Date Taking? Authorizing Provider  cephALEXin (KEFLEX) 500 MG capsule Take 1 capsule (500 mg total) by mouth 4 (four) times daily. 03/10/16   Rise Mu, PA-C    Family History No family history on file.  Social History Social History  Substance Use Topics  . Smoking status: Never Smoker  . Smokeless tobacco: Never Used  . Alcohol use No     Allergies   Review of patient's allergies indicates no known allergies.   Review of Systems Review of Systems  Constitutional: Negative for chills and fever.  HENT: Negative for congestion  and sore throat.   Eyes: Negative.   Respiratory: Negative for cough and shortness of breath.   Cardiovascular: Negative for chest pain.  Gastrointestinal: Negative for abdominal pain, nausea and vomiting.  Musculoskeletal: Negative.   Skin: Positive for rash and wound.  Neurological: Negative for dizziness, syncope, weakness, light-headedness and headaches.     Physical Exam Updated Vital Signs BP 111/71   Pulse 71   Temp 98.7 F (37.1 C) (Oral)   Resp 18   Ht 5' (1.524 m)   Wt 53.2 kg   LMP 03/03/2016   SpO2 100%   Breastfeeding? No   BMI 22.89 kg/m   Physical Exam  Constitutional: She appears well-developed and well-nourished. No distress.  Eyes: Right eye exhibits no discharge. Left eye exhibits no discharge. No scleral icterus.  Pulmonary/Chest: No respiratory distress.  Musculoskeletal: Normal range of motion.  Full ROM of the right elbow. DP pulses are 2+ bilater. Sensation intact. Cap refill normal.  Neurological: She is alert.  Skin: Skin is warm, dry and intact. Capillary refill takes less than 2 seconds. No pallor.     Nursing note and vitals reviewed.    ED Treatments / Results  Labs (all labs ordered are listed, but only abnormal results are displayed) Labs Reviewed - No data to display  EKG  EKG Interpretation None       Radiology No results found.  Procedures Procedures (including critical care time)  Medications Ordered in ED Medications  ibuprofen (ADVIL,MOTRIN) tablet 600 mg (600 mg Oral Given 03/10/16 1218)  cephALEXin (KEFLEX) capsule 500 mg (500 mg Oral Given 03/10/16 1218)     Initial Impression / Assessment and Plan / ED Course  I have reviewed the triage vital signs and the nursing notes.  Pertinent labs & imaging results that were available during my care of the patient were reviewed by me and considered in my medical decision making (see chart for details).  Clinical Course  Patient presentation consistent with  cellulitis. Afebrile. No tachycardia, hypotension or other symptoms suggestive of severe infection. No area of fluctuance appreciated for abscess. She has full range of motion of right elbow low suspicion for septic arthritis. Area has been demarcated and pt advised to follow up for wound check in 2-3 days, sooner for worsening systemic symptoms, new lymphangitis, or significant spread of erythema past line of demarcation. Will discharge with Keflex. Return precautions discussed. Pt appears safe for discharge. She verbalized understanding the plan of care. She is hemodynamically stable and discharged home in no acute distress. Discussed patient with Dr. Dalene SeltzerSchlossman who is in agreement.   Final Clinical Impressions(s) / ED Diagnoses   Final diagnoses:  Cellulitis of right upper extremity    New Prescriptions New Prescriptions   CEPHALEXIN (KEFLEX) 500 MG CAPSULE    Take 1 capsule (500 mg total) by mouth 4 (four) times daily.     Rise MuKenneth T Keldrick Pomplun, PA-C 03/10/16 1259    Alvira MondayErin Schlossman, MD 03/11/16 [phone]

## 2016-06-10 ENCOUNTER — Encounter (HOSPITAL_BASED_OUTPATIENT_CLINIC_OR_DEPARTMENT_OTHER): Payer: Self-pay | Admitting: *Deleted

## 2016-06-10 ENCOUNTER — Observation Stay (HOSPITAL_BASED_OUTPATIENT_CLINIC_OR_DEPARTMENT_OTHER)
Admission: EM | Admit: 2016-06-10 | Discharge: 2016-06-12 | Disposition: A | Discharge status: Home / Self Care | Payer: Medicaid Other | Attending: Internal Medicine | Admitting: Internal Medicine

## 2016-06-10 DIAGNOSIS — O99282 Endocrine, nutritional and metabolic diseases complicating pregnancy, second trimester: Secondary | ICD-10-CM | POA: Insufficient documentation

## 2016-06-10 DIAGNOSIS — Z3490 Encounter for supervision of normal pregnancy, unspecified, unspecified trimester: Secondary | ICD-10-CM | POA: Diagnosis present

## 2016-06-10 DIAGNOSIS — O0001 Abdominal pregnancy with intrauterine pregnancy: Secondary | ICD-10-CM | POA: Diagnosis not present

## 2016-06-10 DIAGNOSIS — O219 Vomiting of pregnancy, unspecified: Secondary | ICD-10-CM | POA: Insufficient documentation

## 2016-06-10 DIAGNOSIS — Z3A14 14 weeks gestation of pregnancy: Secondary | ICD-10-CM | POA: Insufficient documentation

## 2016-06-10 DIAGNOSIS — E861 Hypovolemia: Secondary | ICD-10-CM | POA: Diagnosis not present

## 2016-06-10 DIAGNOSIS — D509 Iron deficiency anemia, unspecified: Secondary | ICD-10-CM | POA: Diagnosis present

## 2016-06-10 DIAGNOSIS — J101 Influenza due to other identified influenza virus with other respiratory manifestations: Secondary | ICD-10-CM | POA: Diagnosis not present

## 2016-06-10 DIAGNOSIS — O99512 Diseases of the respiratory system complicating pregnancy, second trimester: Secondary | ICD-10-CM | POA: Diagnosis not present

## 2016-06-10 DIAGNOSIS — O2342 Unspecified infection of urinary tract in pregnancy, second trimester: Secondary | ICD-10-CM | POA: Diagnosis not present

## 2016-06-10 DIAGNOSIS — O99012 Anemia complicating pregnancy, second trimester: Secondary | ICD-10-CM | POA: Diagnosis not present

## 2016-06-10 DIAGNOSIS — E871 Hypo-osmolality and hyponatremia: Secondary | ICD-10-CM | POA: Insufficient documentation

## 2016-06-10 DIAGNOSIS — E860 Dehydration: Secondary | ICD-10-CM | POA: Diagnosis not present

## 2016-06-10 DIAGNOSIS — J111 Influenza due to unidentified influenza virus with other respiratory manifestations: Secondary | ICD-10-CM

## 2016-06-10 DIAGNOSIS — R319 Hematuria, unspecified: Secondary | ICD-10-CM

## 2016-06-10 DIAGNOSIS — R112 Nausea with vomiting, unspecified: Secondary | ICD-10-CM | POA: Diagnosis not present

## 2016-06-10 DIAGNOSIS — N390 Urinary tract infection, site not specified: Secondary | ICD-10-CM | POA: Diagnosis present

## 2016-06-10 LAB — CBC
HCT: 25.2 % — ABNORMAL LOW (ref 36.0–46.0)
HEMOGLOBIN: 7.6 g/dL — AB (ref 12.0–15.0)
MCH: 17.4 pg — ABNORMAL LOW (ref 26.0–34.0)
MCHC: 30.2 g/dL (ref 30.0–36.0)
MCV: 57.5 fL — ABNORMAL LOW (ref 78.0–100.0)
PLATELETS: 289 10*3/uL (ref 150–400)
RBC: 4.38 MIL/uL (ref 3.87–5.11)
RDW: 19 % — ABNORMAL HIGH (ref 11.5–15.5)
WBC: 8.5 10*3/uL (ref 4.0–10.5)

## 2016-06-10 LAB — I-STAT CG4 LACTIC ACID, ED
LACTIC ACID, VENOUS: 2.77 mmol/L — AB (ref 0.5–1.9)
LACTIC ACID, VENOUS: 1.25 mmol/L (ref 0.5–1.9)

## 2016-06-10 LAB — TYPE AND SCREEN
ABO/RH(D): B POS
ANTIBODY SCREEN: NEGATIVE

## 2016-06-10 LAB — BASIC METABOLIC PANEL
Anion gap: 7 (ref 5–15)
BUN: 5 mg/dL — ABNORMAL LOW (ref 6–20)
CALCIUM: 9 mg/dL (ref 8.9–10.3)
CHLORIDE: 101 mmol/L (ref 101–111)
CO2: 23 mmol/L (ref 22–32)
CREATININE: 0.58 mg/dL (ref 0.44–1.00)
Glucose, Bld: 84 mg/dL (ref 65–99)
Potassium: 3.6 mmol/L (ref 3.5–5.1)
SODIUM: 131 mmol/L — AB (ref 135–145)

## 2016-06-10 LAB — URINALYSIS, MICROSCOPIC (REFLEX)

## 2016-06-10 LAB — INFLUENZA PANEL BY PCR (TYPE A & B)
INFLAPCR: POSITIVE — AB
INFLBPCR: NEGATIVE

## 2016-06-10 LAB — URINALYSIS, ROUTINE W REFLEX MICROSCOPIC
Bilirubin Urine: NEGATIVE
Glucose, UA: NEGATIVE mg/dL
NITRITE: NEGATIVE
PROTEIN: 30 mg/dL — AB
Specific Gravity, Urine: 1.029 (ref 1.005–1.030)
pH: 6 (ref 5.0–8.0)

## 2016-06-10 LAB — RETICULOCYTES
RBC.: 3.72 MIL/uL — ABNORMAL LOW (ref 3.87–5.11)
Retic Count, Absolute: 63.2 10*3/uL (ref 19.0–186.0)
Retic Ct Pct: 1.7 % (ref 0.4–3.1)

## 2016-06-10 LAB — PREGNANCY, URINE: Preg Test, Ur: POSITIVE — AB

## 2016-06-10 MED ORDER — METOCLOPRAMIDE HCL 5 MG/ML IJ SOLN: 10.0000 mg | Freq: Four times a day (QID) | INTRAMUSCULAR | Status: DC | PRN

## 2016-06-10 MED ORDER — SODIUM CHLORIDE 0.9 % IV BOLUS (SEPSIS)
1000.0000 mL | Freq: Once | INTRAVENOUS | Status: AC
Start: 2016-06-10 — End: 2016-06-10
  Administered 2016-06-10: 1000 mL via INTRAVENOUS

## 2016-06-10 MED ORDER — ACETAMINOPHEN 325 MG PO TABS: 650.0000 mg | ORAL_TABLET | Freq: Four times a day (QID) | ORAL | Status: DC | PRN

## 2016-06-10 MED ORDER — PRENATAL MULTIVITAMIN CH
1.0000 | ORAL_TABLET | Freq: Every day | ORAL | Status: DC
  Administered 2016-06-11 – 2016-06-12 (×2): 1 via ORAL
  Filled 2016-06-10 (×2): qty 1

## 2016-06-10 MED ORDER — ACETAMINOPHEN 500 MG PO TABS
1000.0000 mg | ORAL_TABLET | Freq: Once | ORAL | Status: AC
  Administered 2016-06-10: 1000 mg via ORAL
  Filled 2016-06-10: qty 2

## 2016-06-10 MED ORDER — OSELTAMIVIR PHOSPHATE 75 MG PO CAPS
75.0000 mg | ORAL_CAPSULE | Freq: Once | ORAL | Status: AC
  Administered 2016-06-10: 75 mg via ORAL
  Filled 2016-06-10: qty 1

## 2016-06-10 MED ORDER — CEPHALEXIN 500 MG PO CAPS
500.0000 mg | ORAL_CAPSULE | Freq: Four times a day (QID) | ORAL | Status: DC
  Administered 2016-06-10 – 2016-06-12 (×6): 500 mg via ORAL
  Filled 2016-06-10 (×6): qty 1

## 2016-06-10 MED ORDER — METOCLOPRAMIDE HCL 5 MG/ML IJ SOLN
10.0000 mg | Freq: Once | INTRAMUSCULAR | Status: AC
  Administered 2016-06-10: 10 mg via INTRAVENOUS
  Filled 2016-06-10: qty 2

## 2016-06-10 MED ORDER — ACETAMINOPHEN 650 MG RE SUPP: 650.0000 mg | Freq: Four times a day (QID) | RECTAL | Status: DC | PRN

## 2016-06-10 MED ORDER — OSELTAMIVIR PHOSPHATE 75 MG PO CAPS
75.0000 mg | ORAL_CAPSULE | Freq: Two times a day (BID) | ORAL | Status: DC
  Administered 2016-06-11 – 2016-06-12 (×4): 75 mg via ORAL
  Filled 2016-06-10 (×4): qty 1

## 2016-06-10 MED ORDER — SODIUM CHLORIDE 0.9 % IV BOLUS (SEPSIS)
1000.0000 mL | Freq: Once | INTRAVENOUS | Status: AC
  Administered 2016-06-10: 1000 mL via INTRAVENOUS

## 2016-06-10 MED ORDER — HYDROCODONE-ACETAMINOPHEN 5-325 MG PO TABS
1.0000 | ORAL_TABLET | ORAL | Status: DC | PRN
  Administered 2016-06-10: 2 via ORAL
  Filled 2016-06-10: qty 2

## 2016-06-10 MED ORDER — SODIUM CHLORIDE 0.9 % IV SOLN
INTRAVENOUS | Status: AC
Start: 2016-06-10 — End: 2016-06-11
  Administered 2016-06-10: 21:00:00 via INTRAVENOUS

## 2016-06-10 NOTE — ED Notes (Addendum)
Unable to obtain FHT, ED MD aware

## 2016-06-10 NOTE — ED Triage Notes (Signed)
[redacted] weeks pregnant. Cough, body aches, vomiting possible due to coughing so hard.

## 2016-06-10 NOTE — H&P (Signed)
History and Physical    Bethany Cobb ZOX:[id number] DOB: 07-Oct-1990 DOA: 06/10/2016  PCP: No PCP Per Patient   Patient coming from: Home, by way of [facility]   Chief Complaint: Cough, N/V, aches, chills  HPI: Bethany Cobb is a 26 y.o. female with medical history significant for microcytic anemia who presents in transfer from [facility] where she was evaluated for 2 days of chills, malaise, aches, cough, nausea, and vomiting. Patient is currently pregnant and weeks and 4 days gestation and was in her usual state of health until 2 days ago when she developed a mild nonproductive cough. The following day, patient developed chills, body aches, general malaise, nausea, and nonbloody nonbilious vomiting. She had been evaluated the day prior by OB/GYN as part of a routine prenatal appointment, was diagnosed with UTI and prescribed Keflex, but was afebrile at that time and had not yet developed the nausea and vomiting. She denies any recent abdominal pain, vaginal discharge or bleeding, or ankle edema. She denies headaches. She did not receive a flu shot this year. She denies any melena or hematochezia and reports a long-standing history of anemia, but has never been informed of the etiology. One of her 3 children at home is sick with a upper respiratory illness, but had just tested negative for influenza. There's been no recent long distance travel.   [facility] ED Course: Upon arrival to the [facility] ED, patient is found to be febrile to 38.1 C, tachycardic in the 120s, and was soft but stable blood pressure. Chemistry panels notable for a sodium of 131 and CBC features a hemoglobin of 7.6 and MCV of 57.5. Hemoglobin had been 8.2 two days prior. Lactic acid returns elevated to a value of 2.77 and urinalysis features moderate hemoglobin and is suggestive of infection. Flu PCR was positive for influenza A. Urine was sent for culture, 3 L of normal saline were administered, and the patient was treated  with Reglan and Tamiflu in the ED. Tachycardia improved with IVF and BP remained soft but stable. She will be observed for treatment of influenza and UTI with vomiting and dehydration.   Review of Systems:  All other systems reviewed and apart from HPI, are negative.  Past Medical History:  Diagnosis Date  . Chlamydia     Past Surgical History:  Procedure Laterality Date  . NO PAST SURGERIES       reports that she has never smoked. She has never used smokeless tobacco. She reports that she does not drink alcohol or use drugs.  No Known Allergies  History reviewed. No pertinent family history.   Prior to Admission medications   Medication Sig Start Date End Date Taking? Authorizing Provider  cephALEXin (KEFLEX) 500 MG capsule Take 1 capsule (500 mg total) by mouth 4 (four) times daily. 03/10/16   Rise Mu, PA-C    Physical Exam: Vitals:   06/10/16 1926 06/10/16 2007 06/10/16 2100 06/10/16 2104  BP: 111/58 108/58  101/62  Pulse: 117 109  74  Resp:  17  (!) 22  Temp: 97.8 F (36.6 C) 99.1 F (37.3 C)  98.1 F (36.7 C)  TempSrc: Oral Oral  Oral  SpO2: 100% 100%  100%  Weight:   55.2 kg (121 lb 12.8 oz)   Height:   5' (1.524 m)       Constitutional: No respiratory distress, calm, appears uncomfortable Eyes: PERTLA, lids and conjunctivae normal ENMT: Mucous membranes are dry. Posterior pharynx clear of any exudate or  lesions.   Neck: normal, supple, no masses, no thyromegaly Respiratory: clear to auscultation bilaterally, no wheezing, no crackles. Normal respiratory effort.   Cardiovascular: Rate ~110 and regular. No extremity edema. 2+ pedal pulses. No significant JVD. Abdomen: Gravid uterus palpated, no tenderness. Bowel sounds normal.  Musculoskeletal: no clubbing / cyanosis. No joint deformity upper and lower extremities. Normal muscle tone.  Skin: no significant rashes, lesions, ulcers. Warm, dry, well-perfused. Neurologic: CN 2-12 grossly intact.  Sensation intact, DTR normal. Strength 5/5 in all 4 limbs.  Psychiatric: Normal judgment and insight. Alert and oriented x 3. Normal mood and affect.     Labs on Admission: I have personally reviewed following labs and imaging studies  CBC:  Recent Labs Lab 06/10/16 1221  WBC 8.5  HGB 7.6*  HCT 25.2*  MCV 57.5*  PLT 289   Basic Metabolic Panel:  Recent Labs Lab 06/10/16 1221  NA 131*  K 3.6  CL 101  CO2 23  GLUCOSE 84  BUN 5*  CREATININE 0.58  CALCIUM 9.0   GFR: Estimated Creatinine Clearance: 85.3 mL/min (by C-G formula based on SCr of 0.58 mg/dL). Liver Function Tests: No results for input(s): AST, ALT, ALKPHOS, BILITOT, PROT, ALBUMIN in the last 168 hours. No results for input(s): LIPASE, AMYLASE in the last 168 hours. No results for input(s): AMMONIA in the last 168 hours. Coagulation Profile: No results for input(s): INR, PROTIME in the last 168 hours. Cardiac Enzymes: No results for input(s): CKTOTAL, CKMB, CKMBINDEX, TROPONINI in the last 168 hours. BNP (last 3 results) No results for input(s): PROBNP in the last 8760 hours. HbA1C: No results for input(s): HGBA1C in the last 72 hours. CBG: No results for input(s): GLUCAP in the last 168 hours. Lipid Profile: No results for input(s): CHOL, HDL, LDLCALC, TRIG, CHOLHDL, LDLDIRECT in the last 72 hours. Thyroid Function Tests: No results for input(s): TSH, T4TOTAL, FREET4, T3FREE, THYROIDAB in the last 72 hours. Anemia Panel: No results for input(s): VITAMINB12, FOLATE, FERRITIN, TIBC, IRON, RETICCTPCT in the last 72 hours. Urine analysis:    Component Value Date/Time   COLORURINE AMBER (A) 06/10/2016 1120   APPEARANCEUR TURBID (A) 06/10/2016 1120   LABSPEC 1.029 06/10/2016 1120   PHURINE 6.0 06/10/2016 1120   GLUCOSEU NEGATIVE 06/10/2016 1120   HGBUR MODERATE (A) 06/10/2016 1120   BILIRUBINUR NEGATIVE 06/10/2016 1120   KETONESUR >80 (A) 06/10/2016 1120   PROTEINUR 30 (A) 06/10/2016 1120    UROBILINOGEN 1.0 12/20/2014 0640   NITRITE NEGATIVE 06/10/2016 1120   LEUKOCYTESUR LARGE (A) 06/10/2016 1120   Sepsis Labs: @LABRCNTIP (procalcitonin:4,lacticidven:4) )No results found for this or any previous visit (from the past 240 hour(s)).   Radiological Exams on Admission: No results found.  EKG: Not performed, will obtain as appropriate.   Assessment/Plan  1. Influenza A  - She has been started on Tamiflu, will continue  - Supportive care with APAP, IVF, antiemetic - Infection-control measures with droplet precautions   2. UTI  - Diagnosed on 06/08/16, but did not fill prescription for abx  - UA consistent with infection, sample sent for culture  - Treat with empiric Keflex pending culture  3. Intrauterine pregnancy  - [facility]  [phone]- [id number], at 3714w4d on admission - No abd pain, vaginal bleeding or discharge, HA, or swelling  - Continue prenatal vitamin    4. Microcytic anemia  - Hgb is 7.6 on admission with MCV 57.5  - Hgb was 8.2 on 06/08/16  - Pt denies any bleeding or  easy bruising; she is not pale  - She reports long hx of anemia, previously requiring transfusion - Type and screen and anemia panel will be obtained    5. Hyponatremia  - Serum sodium is 130 on admission in setting of vomiting and hypovolemia  - She has been fluid-resuscitated with NS  - Repeat chem panel in am    6. Nausea & vomiting - Likely secondary to influenza and/or UTI  - Improved with Reglan, will continue prn  - Replacing volume and electrolytes as above     DVT prophylaxis: SCD's  Code Status: Full  Family Communication: Discussed with patient Disposition Plan: Observe on med-surg Consults called: None Admission status: Observation    Briscoe Deutscher, MD [facility] Pager [phone]  If 7PM-7AM, please contact night-coverage [email or web address] Password [facility]  06/10/2016, 9:49 PM

## 2016-06-10 NOTE — Progress Notes (Signed)
Called by Dr. Karma GanjaLinker re patient Fullard, 26 yo F [redacted] weeks pregnant with flu like symptoms, tested positive for flu. Requests admission for profound weakness and inability for adequate po intake. Accepted to Medsurg  RN, on patient arrival, please call [phone] and let patient placement RN know of the patient's arrival and a hospitalist will be assigned to admit the patient.   Camelle Henkels M. Elvera LennoxGherghe, MD [facility] [phone]

## 2016-06-10 NOTE — ED Notes (Addendum)
Gave Pt water for fluid challenge and RN Sophie informed of Pts vitals

## 2016-06-10 NOTE — ED Notes (Addendum)
[redacted] weeks pregnant, was seen by OB on Tuesday and was not having Flu symptoms until yesterday. Aching, cough, n/v. Denies vag bleeding or abd pain

## 2016-06-10 NOTE — ED Provider Notes (Signed)
[facility] Provider Note   CSN: [id number] Arrival date & time: 06/10/16  1048     History   Chief Complaint Chief Complaint  Patient presents with  . Influenza    HPI Bethany Cobb is a 26 y.o. female.  HPI  Pt presenting with c/o cough, body aches, vomiting.  Symptoms began 2 days ago.  Pt is [redacted] weeks pregnant.  She states she has been feeling weak- not sure about fever, but has had some chills.  Her mother has had similar symptoms but has not been checked.  No abdominal pain, no vaginal bleeding.  Denies urinary symptoms.  She has not had lightheadedness or fainting.  She does state she has a hx of anemia and is taking iron three times daily with her prenatal vitamins.  [facility]  There are no other associated systemic symptoms, there are no other alleviating or modifying factors.   Past Medical History:  Diagnosis Date  . Chlamydia     Patient Active Problem List   Diagnosis Date Noted  . Dehydration   . Influenza A 06/10/2016  . UTI (urinary tract infection) 06/10/2016  . Abdominal pregnancy with intrauterine pregnancy 06/10/2016  . Microcytic anemia 06/10/2016  . Intractable nausea and vomiting 06/10/2016  . Hyponatremia 06/10/2016    Past Surgical History:  Procedure Laterality Date  . NO PAST SURGERIES      OB History    Gravida Para Term Preterm AB Living   5 3 3     3    SAB TAB Ectopic Multiple Live Births                   Home Medications    Prior to Admission medications   Medication Sig Start Date End Date Taking? Authorizing Provider  Prenatal Vit-Fe Phos-FA-Omega (VITAFOL GUMMIES) 3.33-0.333-34.8 MG CHEW Chew 3 each by mouth daily. 05/21/16  Yes Historical Provider, MD  cephALEXin (KEFLEX) 500 MG capsule Take 1 capsule (500 mg total) by mouth 4 (four) times daily. 06/12/16 06/17/16  Albertine Grates, MD  ferrous sulfate 325 (65 FE) MG tablet Take 1 tablet (325 mg total) by mouth daily with  breakfast. 06/12/16   Albertine Grates, MD  oseltamivir (TAMIFLU) 75 MG capsule Take 1 capsule (75 mg total) by mouth 2 (two) times daily. 06/12/16 06/15/16  Albertine Grates, MD  senna-docusate (SENOKOT-S) 8.6-50 MG tablet Take 1 tablet by mouth at bedtime. 06/12/16   Albertine Grates, MD    Family History History reviewed. No pertinent family history.  Social History Social History  Substance Use Topics  . Smoking status: Never Smoker  . Smokeless tobacco: Never Used  . Alcohol use No     Allergies   Patient has no known allergies.   Review of Systems Review of Systems  ROS reviewed and all otherwise negative except for mentioned in HPI   Physical Exam Updated Vital Signs BP (!) 93/55 (BP Location: Left Arm)   Pulse 80   Temp 98.7 F (37.1 C)   Resp 19   Ht 5' (1.524 m)   Wt 122 lb 9.2 oz (55.6 kg)   LMP 03/10/2016   SpO2 100%   BMI 23.94 kg/m  Vitals reviewed Physical Exam Physical Examination: General appearance - alert, well appearing, and in no distress Mental status - alert, oriented to person, place, and time Eyes - no conjunctival injection, no scleral icterus Mouth - mucous membranes moist, pharynx normal without lesions Neck -  supple, no significant adenopathy Chest - clear to auscultation, no wheezes, rales or rhonchi, symmetric air entry, normal respiratory effort Heart - normal rate, regular rhythm, normal S1, S2, no murmurs, rubs, clicks or gallops Abdomen - soft, nontender, nondistended, no masses or organomegaly, nabs Neurological - alert, oriented x 3, normal speech Extremities - peripheral pulses normal, no pedal edema, no clubbing or cyanosis Skin - normal coloration and turgor, no rashes  ED Treatments / Results  Labs (all labs ordered are listed, but only abnormal results are displayed) Labs Reviewed  URINE CULTURE - Abnormal; Notable for the following:       Result Value   Culture MULTIPLE SPECIES PRESENT, SUGGEST RECOLLECTION (*)    All other components within  normal limits  URINALYSIS, ROUTINE W REFLEX MICROSCOPIC - Abnormal; Notable for the following:    Color, Urine AMBER (*)    APPearance TURBID (*)    Hgb urine dipstick MODERATE (*)    Ketones, ur >80 (*)    Protein, ur 30 (*)    Leukocytes, UA LARGE (*)    All other components within normal limits  CBC - Abnormal; Notable for the following:    Hemoglobin 7.6 (*)    HCT 25.2 (*)    MCV 57.5 (*)    MCH 17.4 (*)    RDW 19.0 (*)    All other components within normal limits  BASIC METABOLIC PANEL - Abnormal; Notable for the following:    Sodium 131 (*)    BUN 5 (*)    All other components within normal limits  INFLUENZA PANEL BY PCR (TYPE A & B) - Abnormal; Notable for the following:    Influenza A By PCR POSITIVE (*)    All other components within normal limits  PREGNANCY, URINE - Abnormal; Notable for the following:    Preg Test, Ur POSITIVE (*)    All other components within normal limits  URINALYSIS, MICROSCOPIC (REFLEX) - Abnormal; Notable for the following:    Bacteria, UA MANY (*)    Squamous Epithelial / LPF TOO NUMEROUS TO COUNT (*)    All other components within normal limits  BASIC METABOLIC PANEL - Abnormal; Notable for the following:    BUN <5 (*)    Creatinine, Ser 0.40 (*)    Calcium 8.2 (*)    Anion gap 4 (*)    All other components within normal limits  IRON AND TIBC - Abnormal; Notable for the following:    Iron 11 (*)    Saturation Ratios 2 (*)    All other components within normal limits  FERRITIN - Abnormal; Notable for the following:    Ferritin 7 (*)    All other components within normal limits  RETICULOCYTES - Abnormal; Notable for the following:    RBC. 3.72 (*)    All other components within normal limits  CBC WITH DIFFERENTIAL/PLATELET - Abnormal; Notable for the following:    Hemoglobin 7.0 (*)    HCT 23.8 (*)    MCV 58.9 (*)    MCH 17.3 (*)    MCHC 29.4 (*)    RDW 19.4 (*)    All other components within normal limits  BASIC METABOLIC PANEL  - Abnormal; Notable for the following:    BUN <5 (*)    All other components within normal limits  HEPATIC FUNCTION PANEL - Abnormal; Notable for the following:    Total Protein 6.0 (*)    Albumin 2.4 (*)    ALT 8 (*)  Alkaline Phosphatase 34 (*)    Bilirubin, Direct <0.1 (*)    All other components within normal limits  I-STAT CG4 LACTIC ACID, ED - Abnormal; Notable for the following:    Lactic Acid, Venous 2.77 (*)    All other components within normal limits  VITAMIN B12  FOLATE  GLUCOSE, CAPILLARY  TSH  GLUCOSE, CAPILLARY  I-STAT CG4 LACTIC ACID, ED  TYPE AND SCREEN  ABO/RH    EKG  EKG Interpretation None       Radiology No results found.  Procedures Procedures (including critical care time)  Medications Ordered in ED Medications  sodium chloride 0.9 % bolus 1,000 mL (0 mLs Intravenous Stopped 06/10/16 1329)  metoCLOPramide (REGLAN) injection 10 mg (10 mg Intravenous Given 06/10/16 1228)  acetaminophen (TYLENOL) tablet 1,000 mg (1,000 mg Oral Given 06/10/16 1335)  sodium chloride 0.9 % bolus 1,000 mL (0 mLs Intravenous Stopped 06/10/16 1439)  oseltamivir (TAMIFLU) capsule 75 mg (75 mg Oral Given 06/10/16 1501)  sodium chloride 0.9 % bolus 1,000 mL (0 mLs Intravenous Stopped 06/10/16 1726)  metoCLOPramide (REGLAN) injection 10 mg (10 mg Intravenous Given 06/10/16 1553)  0.9 %  sodium chloride infusion ( Intravenous New Bag/Given 06/10/16 2121)     Initial Impression / Assessment and Plan / ED Course  I have reviewed the triage vital signs and the nursing notes.  Pertinent labs & imaging results that were available during my care of the patient were reviewed by me and considered in my medical decision making (see chart for details).  12:00PM- pt with flu like symptoms in early pregnancy.  Will need tamiflu based on CDC recommendations to treat "known or suspected" flu in pregnant patients.  Flu swab sent but this is a send out lab and results may not be available in  a timely fashion.  Plan for basic labs, IV fluids, reglan for nausea.  Pt has no tachypnea or hypoxia to suggest pneumonia, so CXR deferred due to early pregnancy.      3:43 PM pt is feeling improved after IV fluids, but does continue to feel very fatigued.  She has been able to keep down po fluids in the ED. Mild tachycardia thought to be due to viral process as well as early pregnancy.  Pt states she has hx of anemia and knows that her hemoglobin has been as low as 7-8 in the past.  She is currently taking iron supplements three times daily.  Pt initially was planning to be discharged on tamiflu. After long discussion with patient she now states she would prefer to be admitted.  She has had one measured temp of 100.6- other temps were 99.  Her WBC is normal.  She has documented influenza infection.  She definitely does not have bacterial sepsis so will not draw blood cultures or start antibiotics.  Pt has been started on tamiflu.  Will obtain lactate and will call hospitalist for admission.  Pt's doctor's are with [facility]- however we have called and there are no beds available at [facility] for admission.    3:57 PM lactate is mildly elevated at 2.7-continuing fluid hydration and has received tamiflu.  Paging hospitalist now.  4:50 PM  D/w Dr. Elvera LennoxGherghe, [facility] for admission.  Pt to go to med/surg bed, observation.      Final Clinical Impressions(s) / ED Diagnoses   Final diagnoses:  Influenza  Dehydration  Intractable vomiting with nausea, unspecified vomiting type  Pregnancy at early stage  New Prescriptions Discharge Medication List as of 06/12/2016  1:14 PM    START taking these medications   Details  ferrous sulfate 325 (65 FE) MG tablet Take 1 tablet (325 mg total) by mouth daily with breakfast., Starting Sat 06/12/2016, Normal    oseltamivir (TAMIFLU) 75 MG capsule Take 1 capsule (75 mg total) by mouth 2 (two) times daily., Starting Sat 06/12/2016,  Until Tue 06/15/2016, Normal    senna-docusate (SENOKOT-S) 8.6-50 MG tablet Take 1 tablet by mouth at bedtime., Starting Sat 06/12/2016, Normal         Jerelyn Scott, MD 06/15/16 2248

## 2016-06-11 DIAGNOSIS — J101 Influenza due to other identified influenza virus with other respiratory manifestations: Secondary | ICD-10-CM

## 2016-06-11 DIAGNOSIS — O0001 Abdominal pregnancy with intrauterine pregnancy: Secondary | ICD-10-CM

## 2016-06-11 DIAGNOSIS — D509 Iron deficiency anemia, unspecified: Secondary | ICD-10-CM

## 2016-06-11 DIAGNOSIS — E860 Dehydration: Secondary | ICD-10-CM

## 2016-06-11 DIAGNOSIS — E871 Hypo-osmolality and hyponatremia: Secondary | ICD-10-CM | POA: Diagnosis not present

## 2016-06-11 DIAGNOSIS — R112 Nausea with vomiting, unspecified: Secondary | ICD-10-CM

## 2016-06-11 DIAGNOSIS — N390 Urinary tract infection, site not specified: Secondary | ICD-10-CM

## 2016-06-11 LAB — URINE CULTURE

## 2016-06-11 LAB — BASIC METABOLIC PANEL
Anion gap: 4 — ABNORMAL LOW (ref 5–15)
BUN: <5 mg/dL — ABNORMAL LOW (ref 6–20)
CHLORIDE: 109 mmol/L (ref 101–111)
CO2: 22 mmol/L (ref 22–32)
Calcium: 8.2 mg/dL — ABNORMAL LOW (ref 8.9–10.3)
Creatinine, Ser: 0.4 mg/dL — ABNORMAL LOW (ref 0.44–1.00)
GFR calc non Af Amer: >60 mL/min (ref >60)
Glucose, Bld: 78 mg/dL (ref 65–99)
POTASSIUM: 3.5 mmol/L (ref 3.5–5.1)
Sodium: 135 mmol/L (ref 135–145)

## 2016-06-11 LAB — IRON AND TIBC
Iron: 11 ug/dL — ABNORMAL LOW (ref 28–170)
SATURATION RATIOS: 2 % — AB (ref 10.4–31.8)
TIBC: 441 ug/dL (ref 250–450)
UIBC: 430 ug/dL

## 2016-06-11 LAB — FERRITIN: Ferritin: 7 ng/mL — ABNORMAL LOW (ref 11–307)

## 2016-06-11 LAB — VITAMIN B12: Vitamin B-12: 204 pg/mL (ref 180–914)

## 2016-06-11 LAB — GLUCOSE, CAPILLARY: Glucose-Capillary: 78 mg/dL (ref 65–99)

## 2016-06-11 LAB — ABO/RH: ABO/RH(D): B POS

## 2016-06-11 LAB — FOLATE: FOLATE: 15 ng/mL (ref >5.9)

## 2016-06-11 NOTE — Progress Notes (Signed)
PROGRESS NOTE  Bethany Cobb AVW:[id number] DOB: [birth year] DOA: 06/10/2016 PCP: No PCP Per Patient  HPI/Recap of past 24 hours:  Fever subsided, still feel nauseous, but no more vomiting  Assessment/Plan: Principal Problem:   Intractable nausea and vomiting Active Problems:   Influenza A   UTI (urinary tract infection)   Abdominal pregnancy with intrauterine pregnancy   Microcytic anemia   Hyponatremia   Dehydration  . Influenza A  - She has been started on Tamiflu, will continue  - Supportive care with APAP, IVF, antiemetic - Infection-control measures with droplet precautions   2. UTI  - Diagnosed on 06/08/16, but did not fill prescription for abx  - UA consistent with infection, sample sent for culture  - Treat with empiric Keflex pending culture  3. Intrauterine pregnancy  - Follows with [facility]  [phone], at [redacted]w[redacted]d on admission - No abd pain, vaginal bleeding or discharge, HA, or swelling  - Continue prenatal vitamin    4. Microcytic anemia  - Hgb is 7.6 on admission with MCV 57.5  - Hgb was 8.2 on 06/08/16  - Pt denies any bleeding or easy bruising; she is not pale  - She reports long hx of anemia, previously requiring transfusion - Type and screen and anemia panel will be obtained    5. Hyponatremia  - Serum sodium is 130 on admission in setting of vomiting and hypovolemia  - She has been fluid-resuscitated with NS  - na normalized   6. Nausea & vomiting - Likely secondary to influenza and/or UTI  - Improved with Reglan, will continue prn  - Replacing volume and electrolytes as above     DVT prophylaxis: SCD's  Code Status: Full  Family Communication: Discussed with patient  Disposition Plan: home on 1/27 if clinically improving   Consultants:  Fetal monitor  Procedures:  none  Antibiotics:  tamiflu  keflex   Objective: BP (!) 95/52 (BP Location: Right Arm)   Pulse 93   Temp 98.2 F (36.8 C) (Oral)   Resp 20   Ht 5'  (1.524 m)   Wt 55.8 kg (123 lb 0.3 oz)   LMP 03/10/2016   SpO2 99%   BMI 24.03 kg/m   Intake/Output Summary (Last 24 hours) at 06/11/16 1500 Last data filed at 06/11/16 1326  Gross per 24 hour  Intake             2080 ml  Output             1400 ml  Net              680 ml   Filed Weights   06/10/16 1053 06/10/16 2100 06/11/16 0500  Weight: 54 kg (119 lb) 55.2 kg (121 lb 12.8 oz) 55.8 kg (123 lb 0.3 oz)    Exam:   General:  NAD  Cardiovascular: RRR  Respiratory: CTABL  Abdomen: Soft/ND/NT, positive BS, gravid uterus  Musculoskeletal: No Edema  Neuro: aaox3  Data Reviewed: Basic Metabolic Panel:  Recent Labs Lab 06/10/16 1221 06/11/16 0537  NA 131* 135  K 3.6 3.5  CL 101 109  CO2 23 22  GLUCOSE 84 78  BUN 5* <5*  CREATININE 0.58 0.40*  CALCIUM 9.0 8.2*   Liver Function Tests: No results for input(s): AST, ALT, ALKPHOS, BILITOT, PROT, ALBUMIN in the last 168 hours. No results for input(s): LIPASE, AMYLASE in the last 168 hours. No results for input(s): AMMONIA in the last 168 hours. CBC:  Recent Labs  Lab 06/10/16 1221  WBC 8.5  HGB 7.6*  HCT 25.2*  MCV 57.5*  PLT 289   Cardiac Enzymes:   No results for input(s): CKTOTAL, CKMB, CKMBINDEX, TROPONINI in the last 168 hours. BNP (last 3 results) No results for input(s): BNP in the last 8760 hours.  ProBNP (last 3 results) No results for input(s): PROBNP in the last 8760 hours.  CBG:  Recent Labs Lab 06/11/16 0817  GLUCAP 78    Recent Results (from the past 240 hour(s))  Urine culture     Status: Abnormal   Collection Time: 06/10/16 11:39 AM  Result Value Ref Range Status   Specimen Description URINE, CLEAN CATCH  Final   Special Requests NONE  Final   Culture MULTIPLE SPECIES PRESENT, SUGGEST RECOLLECTION (A)  Final   Report Status 06/11/2016 FINAL  Final     Studies: No results found.  Scheduled Meds: . cephALEXin  500 mg Oral QID  . oseltamivir  75 mg Oral BID  . prenatal  multivitamin  1 tablet Oral Q1200    Continuous Infusions:   Time spent: 35mins  Joscelin Fray MD, PhD  [facility] Pager [phone]. If 7PM-7AM, please contact night-coverage at [email or web address], password [email or web address] 06/11/2016, 3:00 PM  LOS: 0 days

## 2016-06-11 NOTE — Progress Notes (Signed)
ED RN completed fetal monitoring check on patient. HR 166. Patient has no c/o of pain or distress. Will continue to monitor.

## 2016-06-11 NOTE — Progress Notes (Signed)
Pt was admitted from ED per stretcher accompanied by a nurse tech, pt looks drowsy and unsteady with her gait, falls back asleep so easily but easily arousable, she is oriented but disoriented to the situation, self introduced to pt, fall prevention plan discussed with pt but need re-enforcement, ID bracelet checked call light and phone within reach and pt able to demonstrate how to use them, treatment started admission hx not done pt cannot stay awake to give  Answers to question been asked, I will continue to monitor

## 2016-06-12 DIAGNOSIS — E871 Hypo-osmolality and hyponatremia: Secondary | ICD-10-CM | POA: Diagnosis not present

## 2016-06-12 DIAGNOSIS — J101 Influenza due to other identified influenza virus with other respiratory manifestations: Secondary | ICD-10-CM | POA: Diagnosis not present

## 2016-06-12 DIAGNOSIS — E860 Dehydration: Secondary | ICD-10-CM | POA: Diagnosis not present

## 2016-06-12 DIAGNOSIS — O0001 Abdominal pregnancy with intrauterine pregnancy: Secondary | ICD-10-CM | POA: Diagnosis not present

## 2016-06-12 LAB — BASIC METABOLIC PANEL
Anion gap: 9 (ref 5–15)
CALCIUM: 8.9 mg/dL (ref 8.9–10.3)
CO2: 26 mmol/L (ref 22–32)
CREATININE: 0.47 mg/dL (ref 0.44–1.00)
Chloride: 103 mmol/L (ref 101–111)
GFR calc Af Amer: >60 mL/min (ref >60)
GFR calc non Af Amer: >60 mL/min (ref >60)
GLUCOSE: 80 mg/dL (ref 65–99)
Potassium: 3.6 mmol/L (ref 3.5–5.1)
SODIUM: 138 mmol/L (ref 135–145)

## 2016-06-12 LAB — CBC WITH DIFFERENTIAL/PLATELET
BASOS PCT: 0 %
Basophils Absolute: 0 10*3/uL (ref 0.0–0.1)
EOS ABS: 0.2 10*3/uL (ref 0.0–0.7)
EOS PCT: 3 %
HCT: 23.8 % — ABNORMAL LOW (ref 36.0–46.0)
Hemoglobin: 7 g/dL — ABNORMAL LOW (ref 12.0–15.0)
LYMPHS ABS: 1.6 10*3/uL (ref 0.7–4.0)
Lymphocytes Relative: 25 %
MCH: 17.3 pg — AB (ref 26.0–34.0)
MCHC: 29.4 g/dL — AB (ref 30.0–36.0)
MCV: 58.9 fL — AB (ref 78.0–100.0)
MONO ABS: 0.7 10*3/uL (ref 0.1–1.0)
Monocytes Relative: 10 %
NEUTROS PCT: 62 %
Neutro Abs: 4 10*3/uL (ref 1.7–7.7)
PLATELETS: 284 10*3/uL (ref 150–400)
RBC: 4.04 MIL/uL (ref 3.87–5.11)
RDW: 19.4 % — AB (ref 11.5–15.5)
WBC: 6.5 10*3/uL (ref 4.0–10.5)

## 2016-06-12 LAB — HEPATIC FUNCTION PANEL
ALT: 8 U/L — ABNORMAL LOW (ref 14–54)
AST: 18 U/L (ref 15–41)
Albumin: 2.4 g/dL — ABNORMAL LOW (ref 3.5–5.0)
Alkaline Phosphatase: 34 U/L — ABNORMAL LOW (ref 38–126)
TOTAL PROTEIN: 6 g/dL — AB (ref 6.5–8.1)
Total Bilirubin: 0.3 mg/dL (ref 0.3–1.2)

## 2016-06-12 LAB — GLUCOSE, CAPILLARY: GLUCOSE-CAPILLARY: 72 mg/dL (ref 65–99)

## 2016-06-12 LAB — TSH: TSH: 0.53 u[IU]/mL (ref 0.350–4.500)

## 2016-06-12 MED ORDER — CEPHALEXIN 500 MG PO CAPS: 500.0000 mg | ORAL_CAPSULE | Freq: Four times a day (QID) | ORAL | 0 refills | Status: AC

## 2016-06-12 MED ORDER — FERROUS SULFATE 325 (65 FE) MG PO TABS: 325.0000 mg | ORAL_TABLET | Freq: Every day | ORAL | 0 refills | Status: AC

## 2016-06-12 MED ORDER — OSELTAMIVIR PHOSPHATE 75 MG PO CAPS: 75.0000 mg | ORAL_CAPSULE | Freq: Two times a day (BID) | ORAL | 0 refills | Status: AC

## 2016-06-12 MED ORDER — SENNOSIDES-DOCUSATE SODIUM 8.6-50 MG PO TABS: 1.0000 | ORAL_TABLET | Freq: Every day | ORAL | 0 refills | Status: AC

## 2016-06-12 NOTE — Discharge Summary (Signed)
Discharge Summary  Bethany Cobb ZOX:[id number] DOB: [birth year]  PCP: No PCP Per Patient  Admit date: 06/10/2016 Discharge date: 06/12/2016  Time spent: <73mins  Recommendations for Outpatient Follow-up:  1. F/u with Dr Ferdinand Cava within a week  for hospital discharge follow up, repeat cbc/bmp at follow up  Discharge Diagnoses:  Active Hospital Problems   Diagnosis Date Noted  . Intractable nausea and vomiting 06/10/2016  . Dehydration   . Influenza A 06/10/2016  . UTI (urinary tract infection) 06/10/2016  . Abdominal pregnancy with intrauterine pregnancy 06/10/2016  . Microcytic anemia 06/10/2016  . Hyponatremia 06/10/2016    Resolved Hospital Problems   Diagnosis Date Noted Date Resolved  No resolved problems to display.    Discharge Condition: stable  Diet recommendation: regular diet  Filed Weights   06/10/16 2100 06/11/16 0500 06/12/16 0145  Weight: 55.2 kg (121 lb 12.8 oz) 55.8 kg (123 lb 0.3 oz) 55.6 kg (122 lb 9.2 oz)    History of present illness:  Patient coming from: Home, by way of [facility]   Chief Complaint: Cough, N/V, aches, chills  HPI: Bethany Cobb is a 26 y.o. female with medical history significant for microcytic anemia who presents in transfer from [facility] where she was evaluated for 2 days of chills, malaise, aches, cough, nausea, and vomiting. Patient is currently pregnant and weeks and 4 days gestation and was in her usual state of health until 2 days ago when she developed a mild nonproductive cough. The following day, patient developed chills, body aches, general malaise, nausea, and nonbloody nonbilious vomiting. She had been evaluated the day prior by OB/GYN as part of a routine prenatal appointment, was diagnosed with UTI and prescribed Keflex, but was afebrile at that time and had not yet developed the nausea and vomiting. She denies any recent abdominal pain, vaginal discharge or bleeding, or ankle edema. She denies headaches.  She did not receive a flu shot this year. She denies any melena or hematochezia and reports a long-standing history of anemia, but has never been informed of the etiology. One of her 3 children at home is sick with a upper respiratory illness, but had just tested negative for influenza. There's been no recent long distance travel.   [facility] ED Course: Upon arrival to [facility] ED, patient is found to be febrile to 38.1 C, tachycardic in the 120s, and was soft but stable blood pressure. Chemistry panels notable for a sodium of 131 and CBC features a hemoglobin of 7.6 and MCV of 57.5. Hemoglobin had been 8.2 two days prior. Lactic acid returns elevated to a value of 2.77 and urinalysis features moderate hemoglobin and is suggestive of infection. Flu PCR was positive for influenza A. Urine was sent for culture, 3 L of normal saline were administered, and the patient was treated with Reglan and Tamiflu in the ED. Tachycardia improved with IVF and BP remained soft but stable. She will be observed for treatment of influenza and UTI with vomiting and dehydration.    Hospital Course:  Principal Problem:   Intractable nausea and vomiting Active Problems:   Influenza A   UTI (urinary tract infection)   Abdominal pregnancy with intrauterine pregnancy   Microcytic anemia   Hyponatremia   Dehydration   1. Influenza A  - Tamiflu for 5 days  - Supportive care with APAP, IVF, antiemetic - Infection-control measures with droplet precautions   2. UTI  - Diagnosed on 06/08/16, but did not fill prescription for abx  -  UA consistent with infection with many bacteria, large leukocytes, negative negative, wbc TNTC. urine culture with multiple species - Treat with empiric Keflex for total of 5 days  3. Intrauterine pregnancy  - Follows with [facility]  [phone], at [redacted]w[redacted]d on admission - No abd pain, vaginal bleeding or discharge, HA, or swelling  - Continue prenatal vitamin   4. Microcytic anemia /iron  deficiency, no sign of blood loss - Hgb is 7.6 on admission with MCV 57.5  - Hgb was 8.2 on 06/08/16  - Pt denies any bleeding or easy bruising; she is not pale  - She reports long hx of anemia, previously requiring transfusion - she is started on iron supplement, close follow up with Dr Ferdinand Cava  5. Hyponatremia  - Serum sodium is 130 on admission in setting of vomiting and hypovolemia  - She has been fluid-resuscitated with NS  - na normalized   6. Nausea & vomiting - Likely secondary to influenza and/or UTI  -  Replacing volume and electrolytes as above  Improved, tolerating diet   DVT prophylaxis:SCD's  Code Status:Full  Family Communication:Discussed with patient  Disposition Plan: home on 1/27    Consultants:  Fetal monitor  Procedures:  none  Antibiotics:  tamiflu  keflex   Discharge Exam: BP (!) 93/55 (BP Location: Left Arm)   Pulse 80   Temp 98.7 F (37.1 C)   Resp 19   Ht 5' (1.524 m)   Wt 55.6 kg (122 lb 9.2 oz)   LMP 03/10/2016   SpO2 100%   BMI 23.94 kg/m   General: NAD Cardiovascular: RRR Respiratory: CATBL  Discharge Instructions You were cared for by a hospitalist during your hospital stay. If you have any questions about your discharge medications or the care you received while you were in the hospital after you are discharged, you can call the unit and asked to speak with the hospitalist on call if the hospitalist that took care of you is not available. Once you are discharged, your primary care physician will handle any further medical issues. Please note that NO REFILLS for any discharge medications will be authorized once you are discharged, as it is imperative that you return to your primary care physician (or establish a relationship with a primary care physician if you do not have one) for your aftercare needs so that they can reassess your need for medications and monitor your lab values.  Discharge Instructions    Diet  general    Complete by:  As directed    Regular diet   Increase activity slowly    Complete by:  As directed      Allergies as of 06/12/2016   No Known Allergies     Medication List    TAKE these medications   cephALEXin 500 MG capsule Commonly known as:  KEFLEX Take 1 capsule (500 mg total) by mouth 4 (four) times daily.   ferrous sulfate 325 (65 FE) MG tablet Take 1 tablet (325 mg total) by mouth daily with breakfast.   oseltamivir 75 MG capsule Commonly known as:  TAMIFLU Take 1 capsule (75 mg total) by mouth 2 (two) times daily.   senna-docusate 8.6-50 MG tablet Commonly known as:  Senokot-S Take 1 tablet by mouth at bedtime.   VITAFOL GUMMIES 3.33-0.333-34.8 MG Chew Chew 3 each by mouth daily.      No Known Allergies Follow-up Information    ARUS,ARIEL, MD Follow up in 1 week(s).   Specialty:  Obstetrics  and Gynecology Why:  repeat cbc at follow up , Dr Ferdinand CavaArus to decide on blood transfusion if needed. Contact information: [address] [facility] [address] [place] [address] [phone]            The results of significant diagnostics from this hospitalization (including imaging, microbiology, ancillary and laboratory) are listed below for reference.    Significant Diagnostic Studies: No results found.  Microbiology: Recent Results (from the past 240 hour(s))  Urine culture     Status: Abnormal   Collection Time: 06/10/16 11:39 AM  Result Value Ref Range Status   Specimen Description URINE, CLEAN CATCH  Final   Special Requests NONE  Final   Culture MULTIPLE SPECIES PRESENT, SUGGEST RECOLLECTION (A)  Final   Report Status 06/11/2016 FINAL  Final     Labs: Basic Metabolic Panel:  Recent Labs Lab 06/10/16 1221 06/11/16 0537 06/12/16 0521  NA 131* 135 138  K 3.6 3.5 3.6  CL 101 109 103  CO2 23 22 26   GLUCOSE 84 78 80  BUN 5* <5* <5*  CREATININE 0.58 0.40* 0.47  CALCIUM 9.0 8.2* 8.9   Liver Function Tests:  Recent  Labs Lab 06/12/16 0521  AST 18  ALT 8*  ALKPHOS 34*  BILITOT 0.3  PROT 6.0*  ALBUMIN 2.4*   No results for input(s): LIPASE, AMYLASE in the last 168 hours. No results for input(s): AMMONIA in the last 168 hours. CBC:  Recent Labs Lab 06/10/16 1221 06/12/16 0521  WBC 8.5 6.5  NEUTROABS  --  4.0  HGB 7.6* 7.0*  HCT 25.2* 23.8*  MCV 57.5* 58.9*  PLT 289 284   Cardiac Enzymes: No results for input(s): CKTOTAL, CKMB, CKMBINDEX, TROPONINI in the last 168 hours. BNP: BNP (last 3 results) No results for input(s): BNP in the last 8760 hours.  ProBNP (last 3 results) No results for input(s): PROBNP in the last 8760 hours.  CBG:  Recent Labs Lab 06/11/16 0817 06/12/16 0837  GLUCAP 78 72       Signed:  Magali Bray MD, PhD  [facility] 06/12/2016, 12:00 PM

## 2020-07-10 ENCOUNTER — Ambulatory Visit: Payer: Self-pay | Admitting: Family Medicine

## 2020-07-10 DIAGNOSIS — Z0289 Encounter for other administrative examinations: Secondary | ICD-10-CM

## 2020-07-10 NOTE — Progress Notes (Deleted)
   Bethany Cobb is a 30 y.o. female  No chief complaint on file.   HPI: Bethany Cobb is a 30 y.o. female seen today to establish care with our office. She complains of      Past Medical History:  Diagnosis Date  . Chlamydia     Past Surgical History:  Procedure Laterality Date  . NO PAST SURGERIES      Social History   Socioeconomic History  . Marital status: Significant Other    Spouse name: Not on file  . Number of children: Not on file  . Years of education: Not on file  . Highest education level: Not on file  Occupational History  . Not on file  Tobacco Use  . Smoking status: Never Smoker  . Smokeless tobacco: Never Used  Substance and Sexual Activity  . Alcohol use: No  . Drug use: No  . Sexual activity: Yes  Other Topics Concern  . Not on file  Social History Narrative  . Not on file   Social Determinants of Health   Financial Resource Strain: Not on file  Food Insecurity: Not on file  Transportation Needs: Not on file  Physical Activity: Not on file  Stress: Not on file  Social Connections: Not on file  Intimate Partner Violence: Not on file    No family history on file.    There is no immunization history on file for this patient.  Outpatient Encounter Medications as of 07/10/2020  Medication Sig  . ferrous sulfate 325 (65 FE) MG tablet Take 1 tablet (325 mg total) by mouth daily with breakfast.  . Prenatal Vit-Fe Phos-FA-Omega (VITAFOL GUMMIES) 3.33-0.333-34.8 MG CHEW Chew 3 each by mouth daily.  Marland Kitchen senna-docusate (SENOKOT-S) 8.6-50 MG tablet Take 1 tablet by mouth at bedtime.   No facility-administered encounter medications on file as of 07/10/2020.     ROS: Pertinent positives and negatives noted in HPI. Remainder of ROS non-contributory    No Known Allergies  There were no vitals taken for this visit.  Wt Readings from Last 3 Encounters:  06/12/16 122 lb 9.2 oz (55.6 kg)  03/10/16 117 lb 3.2 oz (53.2 kg)  12/20/14 110 lb  (49.9 kg)   Temp Readings from Last 3 Encounters:  06/12/16 98.7 F (37.1 C)  03/10/16 98.7 F (37.1 C) (Oral)  12/20/14 98.4 F (36.9 C) (Oral)   BP Readings from Last 3 Encounters:  06/12/16 (!) 93/55  03/10/16 111/71  12/20/14 124/79   Pulse Readings from Last 3 Encounters:  06/12/16 80  03/10/16 71  12/20/14 82     Physical Exam   A/P:     This visit occurred during the SARS-CoV-2 public health emergency.  Safety protocols were in place, including screening questions prior to the visit, additional usage of staff PPE, and extensive cleaning of exam room while observing appropriate contact time as indicated for disinfecting solutions.

## 2020-08-04 ENCOUNTER — Telehealth: Payer: Self-pay | Admitting: Family Medicine

## 2020-08-04 ENCOUNTER — Encounter: Payer: Self-pay | Admitting: Family Medicine

## 2020-08-04 NOTE — Telephone Encounter (Signed)
Pt was no show for appt 07/10/2020 as new pt. 1st occurrence. Fee waived. Letter mailed.

## 2022-07-20 ENCOUNTER — Other Ambulatory Visit: Payer: Self-pay

## 2022-07-20 ENCOUNTER — Emergency Department (HOSPITAL_BASED_OUTPATIENT_CLINIC_OR_DEPARTMENT_OTHER)
Admission: EM | Admit: 2022-07-20 | Discharge: 2022-07-21 | Disposition: A | Discharge status: Home / Self Care | Payer: No Typology Code available for payment source | Attending: Emergency Medicine | Admitting: Emergency Medicine

## 2022-07-20 ENCOUNTER — Emergency Department (HOSPITAL_BASED_OUTPATIENT_CLINIC_OR_DEPARTMENT_OTHER): Payer: No Typology Code available for payment source

## 2022-07-20 DIAGNOSIS — S7012XA Contusion of left thigh, initial encounter: Secondary | ICD-10-CM | POA: Diagnosis not present

## 2022-07-20 DIAGNOSIS — Y92410 Unspecified street and highway as the place of occurrence of the external cause: Secondary | ICD-10-CM | POA: Diagnosis not present

## 2022-07-20 DIAGNOSIS — R519 Headache, unspecified: Secondary | ICD-10-CM | POA: Insufficient documentation

## 2022-07-20 DIAGNOSIS — S5001XA Contusion of right elbow, initial encounter: Secondary | ICD-10-CM

## 2022-07-20 DIAGNOSIS — S59901A Unspecified injury of right elbow, initial encounter: Secondary | ICD-10-CM | POA: Diagnosis present

## 2022-07-20 LAB — PREGNANCY, URINE: Preg Test, Ur: NEGATIVE

## 2022-07-20 MED ORDER — IBUPROFEN 400 MG PO TABS
600.0000 mg | ORAL_TABLET | Freq: Once | ORAL | Status: AC
  Administered 2022-07-20: 600 mg via ORAL
  Filled 2022-07-20: qty 1

## 2022-07-20 MED ORDER — KETOROLAC TROMETHAMINE 15 MG/ML IJ SOLN
15.0000 mg | Freq: Once | INTRAMUSCULAR | Status: DC
  Filled 2022-07-20: qty 1

## 2022-07-20 MED ORDER — DEXAMETHASONE 4 MG PO TABS
6.0000 mg | ORAL_TABLET | Freq: Once | ORAL | Status: AC
  Administered 2022-07-20: 6 mg via ORAL
  Filled 2022-07-20: qty 2

## 2022-07-20 MED ORDER — METOCLOPRAMIDE HCL 10 MG PO TABS
10.0000 mg | ORAL_TABLET | Freq: Once | ORAL | Status: AC
  Administered 2022-07-20: 10 mg via ORAL
  Filled 2022-07-20: qty 1
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
## 2022-07-21 MED ORDER — CYCLOBENZAPRINE HCL 10 MG PO TABS: 10.0000 mg | ORAL_TABLET | Freq: Three times a day (TID) | ORAL | 0 refills | Status: AC | PRN

## 2022-07-21 MED ORDER — IBUPROFEN 600 MG PO TABS: 600.0000 mg | ORAL_TABLET | Freq: Three times a day (TID) | ORAL | 0 refills | Status: AC | PRN
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
## 2022-08-09 ENCOUNTER — Emergency Department (HOSPITAL_BASED_OUTPATIENT_CLINIC_OR_DEPARTMENT_OTHER): Payer: Self-pay

## 2022-08-09 ENCOUNTER — Encounter (HOSPITAL_BASED_OUTPATIENT_CLINIC_OR_DEPARTMENT_OTHER): Payer: Self-pay | Admitting: Urology

## 2022-08-09 ENCOUNTER — Other Ambulatory Visit: Payer: Self-pay

## 2022-08-09 ENCOUNTER — Emergency Department (HOSPITAL_BASED_OUTPATIENT_CLINIC_OR_DEPARTMENT_OTHER)
Admission: EM | Admit: 2022-08-09 | Discharge: 2022-08-09 | Disposition: A | Discharge status: Home / Self Care | Payer: Self-pay | Attending: Emergency Medicine | Admitting: Emergency Medicine

## 2022-08-09 ENCOUNTER — Ambulatory Visit: Payer: Self-pay | Admitting: Family Medicine

## 2022-08-09 DIAGNOSIS — B9689 Other specified bacterial agents as the cause of diseases classified elsewhere: Secondary | ICD-10-CM | POA: Insufficient documentation

## 2022-08-09 DIAGNOSIS — E876 Hypokalemia: Secondary | ICD-10-CM | POA: Insufficient documentation

## 2022-08-09 DIAGNOSIS — N83201 Unspecified ovarian cyst, right side: Secondary | ICD-10-CM | POA: Insufficient documentation

## 2022-08-09 DIAGNOSIS — D649 Anemia, unspecified: Secondary | ICD-10-CM | POA: Insufficient documentation

## 2022-08-09 DIAGNOSIS — N3000 Acute cystitis without hematuria: Secondary | ICD-10-CM | POA: Insufficient documentation

## 2022-08-09 DIAGNOSIS — N760 Acute vaginitis: Secondary | ICD-10-CM | POA: Insufficient documentation

## 2022-08-09 LAB — COMPREHENSIVE METABOLIC PANEL
ALT: 10 U/L (ref 0–44)
AST: 17 U/L (ref 15–41)
Albumin: 4.1 g/dL (ref 3.5–5.0)
Alkaline Phosphatase: 46 U/L (ref 38–126)
Anion gap: 7 (ref 5–15)
BUN: 6 mg/dL (ref 6–20)
CO2: 24 mmol/L (ref 22–32)
Calcium: 8.9 mg/dL (ref 8.9–10.3)
Chloride: 105 mmol/L (ref 98–111)
Creatinine, Ser: 0.57 mg/dL (ref 0.44–1.00)
GFR, Estimated: >60 mL/min (ref >60)
Glucose, Bld: 85 mg/dL (ref 70–99)
Potassium: 3.4 mmol/L — ABNORMAL LOW (ref 3.5–5.1)
Sodium: 136 mmol/L (ref 135–145)
Total Bilirubin: 0.7 mg/dL (ref 0.3–1.2)
Total Protein: 8.2 g/dL — ABNORMAL HIGH (ref 6.5–8.1)

## 2022-08-09 LAB — URINALYSIS, ROUTINE W REFLEX MICROSCOPIC
Bilirubin Urine: NEGATIVE
Glucose, UA: NEGATIVE mg/dL
Hgb urine dipstick: NEGATIVE
Ketones, ur: NEGATIVE mg/dL
Nitrite: NEGATIVE
Protein, ur: NEGATIVE mg/dL
Specific Gravity, Urine: 1.025 (ref 1.005–1.030)
pH: 7.5 (ref 5.0–8.0)

## 2022-08-09 LAB — CBC WITH DIFFERENTIAL/PLATELET
Abs Immature Granulocytes: 0.02 10*3/uL (ref 0.00–0.07)
Basophils Absolute: 0 10*3/uL (ref 0.0–0.1)
Basophils Relative: 0 %
Eosinophils Absolute: 0.1 10*3/uL (ref 0.0–0.5)
Eosinophils Relative: 1 %
HCT: 27 % — ABNORMAL LOW (ref 36.0–46.0)
Hemoglobin: 7.3 g/dL — ABNORMAL LOW (ref 12.0–15.0)
Immature Granulocytes: 0 %
Lymphocytes Relative: 28 %
Lymphs Abs: 2.2 10*3/uL (ref 0.7–4.0)
MCH: 14.9 pg — ABNORMAL LOW (ref 26.0–34.0)
MCHC: 27 g/dL — ABNORMAL LOW (ref 30.0–36.0)
MCV: 55.1 fL — ABNORMAL LOW (ref 80.0–100.0)
Monocytes Absolute: 0.6 10*3/uL (ref 0.1–1.0)
Monocytes Relative: 8 %
Neutro Abs: 5 10*3/uL (ref 1.7–7.7)
Neutrophils Relative %: 63 %
Platelets: 269 10*3/uL (ref 150–400)
RBC: 4.9 MIL/uL (ref 3.87–5.11)
RDW: 20.8 % — ABNORMAL HIGH (ref 11.5–15.5)
WBC: 7.9 10*3/uL (ref 4.0–10.5)
nRBC: 0 % (ref 0.0–0.2)

## 2022-08-09 LAB — WET PREP, GENITAL
Sperm: NONE SEEN
Trich, Wet Prep: NONE SEEN
WBC, Wet Prep HPF POC: >=10 — AB (ref <10)
Yeast Wet Prep HPF POC: NONE SEEN

## 2022-08-09 LAB — URINALYSIS, MICROSCOPIC (REFLEX)

## 2022-08-09 LAB — PREGNANCY, URINE: Preg Test, Ur: NEGATIVE

## 2022-08-09 MED ORDER — NITROFURANTOIN MONOHYD MACRO 100 MG PO CAPS: 100.0000 mg | ORAL_CAPSULE | Freq: Two times a day (BID) | ORAL | 0 refills | Status: AC

## 2022-08-09 MED ORDER — METRONIDAZOLE 500 MG PO TABS: 500.0000 mg | ORAL_TABLET | Freq: Two times a day (BID) | ORAL | 0 refills | Status: AC

## 2022-08-09 MED ORDER — IOHEXOL 300 MG/ML  SOLN
100.0000 mL | Freq: Once | INTRAMUSCULAR | Status: AC | PRN
  Administered 2022-08-09: 100 mL via INTRAVENOUS

## 2022-08-09 NOTE — Discharge Instructions (Addendum)
You were seen in the ER for evaluation of your abdominal pain. It was discovered that you have an ovarian cyst which maybe what has been causing your pain. Additionally, it appears that you have a urinary tract infection and bacterial vaginosis. You will need to be on two antibiotics for this. Please do not drink while on these medications as they can make you extremely ill.  Please follow-up with your gynecologist for reevaluation of your right cyst.  If you have any worsening abdominal pain, nausea, vomiting, blood in your urine, fever, back pain, please return to the nearest emerged part for evaluation.  Otherwise, if you have any concerns or any worsening symptoms, please return to the nearest emergency department for evaluation.  Contact a doctor if: Your periods: Are late. Are not regular. Stop. Are painful. You have pain in the area between your hip bones, and the pain does not go away. You feel pressure on your bladder. You have trouble peeing. You feel full, or your belly hurts, swells, or bloats. You gain or lose weight without trying, or you are less hungry than normal. You feel pain and pressure in your back. You feel pain and pressure in the area between your hip bones. You think you may be pregnant. Get help right away if: You have pain in your belly that is very bad or gets worse. You have pain in the area between your hip bones, and the pain is very bad or gets worse. You cannot eat or drink without vomiting. You get a fever or chills all of a sudden. Your period is a lot heavier than usual.

## 2022-08-09 NOTE — ED Triage Notes (Signed)
Pt states lower abdominal pain x 1 week, nausea, Denies urinary symptoms, mild vaginal discharge but normal per pt   Pcp appointment cancelled today

## 2022-08-09 NOTE — ED Provider Notes (Signed)
[facility] Provider Note   CSN: [id number] Arrival date & time: 08/09/22  1628     History {Add pertinent medical, surgical, social history, OB history to HPI:1} Chief Complaint  Patient presents with   Abdominal Pain    Bethany Cobb is a 32 y.o. female.   Abdominal Pain      Home Medications Prior to Admission medications   Medication Sig Start Date End Date Taking? Authorizing Provider  ferrous sulfate 325 (65 FE) MG tablet Take 1 tablet (325 mg total) by mouth daily with breakfast. 06/12/16   Florencia Reasons, MD  Prenatal Vit-Fe Phos-FA-Omega (VITAFOL GUMMIES) 3.33-0.333-34.8 MG CHEW Chew 3 each by mouth daily. 05/21/16   [provider]  senna-docusate (SENOKOT-S) 8.6-50 MG tablet Take 1 tablet by mouth at bedtime. 06/12/16   Florencia Reasons, MD      Allergies    Patient has no known allergies.    Review of Systems   Review of Systems  Gastrointestinal:  Positive for abdominal pain.    Physical Exam Updated Vital Signs BP 111/74   Pulse 76   Temp 97.8 F (36.6 C)   Resp 16   Ht 5' (1.524 m)   Wt 61.8 kg   LMP 07/16/2022 (Approximate)   SpO2 100%   BMI 26.61 kg/m  Physical Exam  ED Results / Procedures / Treatments   Labs (all labs ordered are listed, but only abnormal results are displayed) Labs Reviewed  WET PREP, GENITAL - Abnormal; Notable for the following components:      Result Value   Clue Cells Wet Prep HPF POC PRESENT (*)    WBC, Wet Prep HPF POC >=10 (*)    All other components within normal limits  URINALYSIS, ROUTINE W REFLEX MICROSCOPIC - Abnormal; Notable for the following components:   Leukocytes,Ua SMALL (*)    All other components within normal limits  URINALYSIS, MICROSCOPIC (REFLEX) - Abnormal; Notable for the following components:   Bacteria, UA FEW (*)    All other components within normal limits  CBC WITH DIFFERENTIAL/PLATELET - Abnormal; Notable for the following components:    Hemoglobin 7.3 (*)    HCT 27.0 (*)    MCV 55.1 (*)    MCH 14.9 (*)    MCHC 27.0 (*)    RDW 20.8 (*)    All other components within normal limits  COMPREHENSIVE METABOLIC PANEL - Abnormal; Notable for the following components:   Potassium 3.4 (*)    Total Protein 8.2 (*)    All other components within normal limits  URINE CULTURE  PREGNANCY, URINE    EKG None  Radiology CT ABDOMEN PELVIS W CONTRAST  Result Date: 08/09/2022 CLINICAL DATA:  Right lower quadrant pain for 1 week EXAM: CT ABDOMEN AND PELVIS WITH CONTRAST TECHNIQUE: Multidetector CT imaging of the abdomen and pelvis was performed using the standard protocol following bolus administration of intravenous contrast. RADIATION DOSE REDUCTION: This exam was performed according to the departmental dose-optimization program which includes automated exposure control, adjustment of the mA and/or kV according to patient size and/or use of iterative reconstruction technique. CONTRAST:  118mL OMNIPAQUE IOHEXOL 300 MG/ML  SOLN COMPARISON:  None Available. FINDINGS: Lower chest: No acute abnormality. Hepatobiliary: Mild fatty infiltration of the liver is noted. Gallbladder is decompressed. Small 1 cm cyst is noted in the left lobe of the liver. Pancreas: Unremarkable. No pancreatic ductal dilatation or surrounding inflammatory changes. Spleen: Normal in size without focal abnormality. Adrenals/Urinary Tract:  Adrenal glands are within normal limits. Kidneys are well visualized bilaterally. Mildly prominent extrarenal pelves are noted bilaterally without definitive obstructive change. The bladder is decompressed. Stomach/Bowel: No obstructive or inflammatory changes of the colon are seen. The appendix is within normal limits. Small bowel and stomach are unremarkable. Vascular/Lymphatic: No significant vascular findings are present. No enlarged abdominal or pelvic lymph nodes. Reproductive: Uterus is within normal limits. 1.9 cm right adnexal cyst is  noted. No definitive left adnexal mass is noted. Other: No abdominal wall hernia or abnormality. No abdominopelvic ascites. Musculoskeletal: No acute or significant osseous findings. IMPRESSION: Fatty liver. 1.9 cm right adnexal simple-appearing cyst. No follow-up imaging is recommended. Reference: JACR 2020 Feb;17(2):248-254 Normal-appearing appendix. Electronically Signed   By: Inez Catalina M.D.   On: 08/09/2022 19:49    Procedures Procedures  {Document cardiac monitor, telemetry assessment procedure when appropriate:1}  Medications Ordered in ED Medications  iohexol (OMNIPAQUE) 300 MG/ML solution 100 mL (100 mLs Intravenous Contrast Given 08/09/22 1928)    ED Course/ Medical Decision Making/ A&P   {   Click here for ABCD2, HEART and other calculatorsREFRESH Note before signing :1}                          Medical Decision Making Amount and/or Complexity of Data Reviewed Labs: ordered. Radiology: ordered.  Risk Prescription drug management.   ***  {Document critical care time when appropriate:1} {Document review of labs and clinical decision tools ie heart score, Chads2Vasc2 etc:1}  {Document your independent review of radiology images, and any outside records:1} {Document your discussion with family members, caretakers, and with consultants:1} {Document social determinants of health affecting pt's care:1} {Document your decision making why or why not admission, treatments were needed:1} Final Clinical Impression(s) / ED Diagnoses Final diagnoses:  Right ovarian cyst  Bacterial vaginosis  Acute cystitis without hematuria    Rx / DC Orders ED Discharge Orders     None

## 2022-08-11 LAB — URINE CULTURE
